# Patient Record
Sex: Female | Born: 1950
Health system: Southern US, Community
[De-identification: ages and names within clinical notes are randomized; demographics above are authoritative.]

## PROBLEM LIST (undated history)

## (undated) DIAGNOSIS — K7460 Unspecified cirrhosis of liver: Secondary | ICD-10-CM

## (undated) DIAGNOSIS — E0789 Other specified disorders of thyroid: Secondary | ICD-10-CM

## (undated) DIAGNOSIS — C55 Malignant neoplasm of uterus, part unspecified: Secondary | ICD-10-CM

## (undated) DIAGNOSIS — I10 Essential (primary) hypertension: Secondary | ICD-10-CM

## (undated) DIAGNOSIS — R7989 Other specified abnormal findings of blood chemistry: Secondary | ICD-10-CM

## (undated) DIAGNOSIS — A281 Cat-scratch disease: Secondary | ICD-10-CM

## (undated) DIAGNOSIS — K5790 Diverticulosis of intestine, part unspecified, without perforation or abscess without bleeding: Secondary | ICD-10-CM

## (undated) HISTORY — DX: Unspecified cirrhosis of liver: K74.60

## (undated) HISTORY — DX: Malignant neoplasm of uterus, part unspecified: C55

## (undated) HISTORY — DX: Other specified abnormal findings of blood chemistry: R79.89

## (undated) HISTORY — PX: OTHER SURGICAL HISTORY: SHX169

## (undated) HISTORY — PX: CHOLECYSTECTOMY: SHX55

## (undated) HISTORY — DX: Diverticulosis of intestine, part unspecified, without perforation or abscess without bleeding: K57.90

## (undated) HISTORY — PX: BLADDER SURGERY: SHX569

## (undated) HISTORY — DX: Essential (primary) hypertension: I10

## (undated) HISTORY — PX: LIVER BIOPSY: SHX301

## (undated) HISTORY — PX: KNEE SURGERY: SHX244

## (undated) HISTORY — DX: Cat-scratch disease: A28.1

## (undated) HISTORY — DX: Other specified disorders of thyroid: E07.89

## (undated) SURGERY — Surgical Case
Anesthesia: *Unknown

---

## 2016-11-28 HISTORY — PX: ABDOMINAL HYSTERECTOMY: SHX81

## 2017-12-11 IMAGING — US US BIOPSY CORE LIVER
1 series · 7 of 7 positions shown · non-contrast
Comparison: none

CLINICAL DATA: Suspected cirrhosis.  No ascites on previous CT

EXAM:
ULTRASOUND-GUIDED CORE LIVER BIOPSY
TECHNIQUE: An ultrasound guided liver biopsy was thoroughly discussed with the
patient and questions were answered. The benefits, risks,
alternatives, and complications were also discussed. The patient
understands and wishes to proceed with the procedure. A verbal as
well as written consent was obtained.

[Series 1: us biopsy core liver · 0.25mm/px · 7 of 7 slices shown]
[im 1/7]
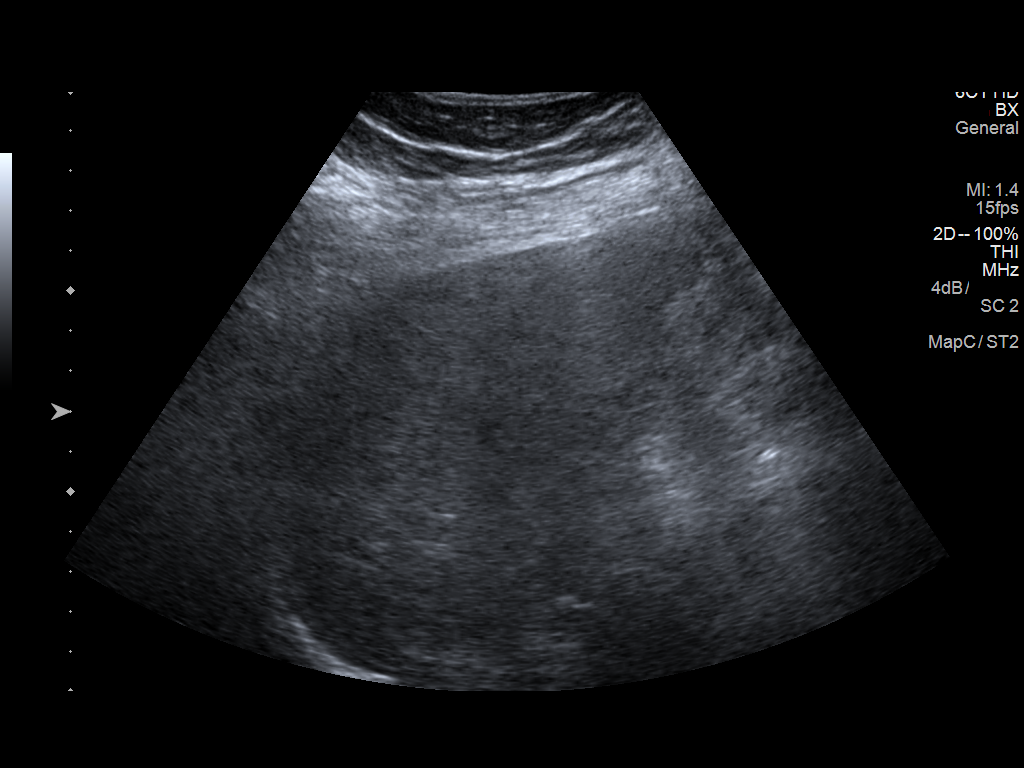
[im 2/7]
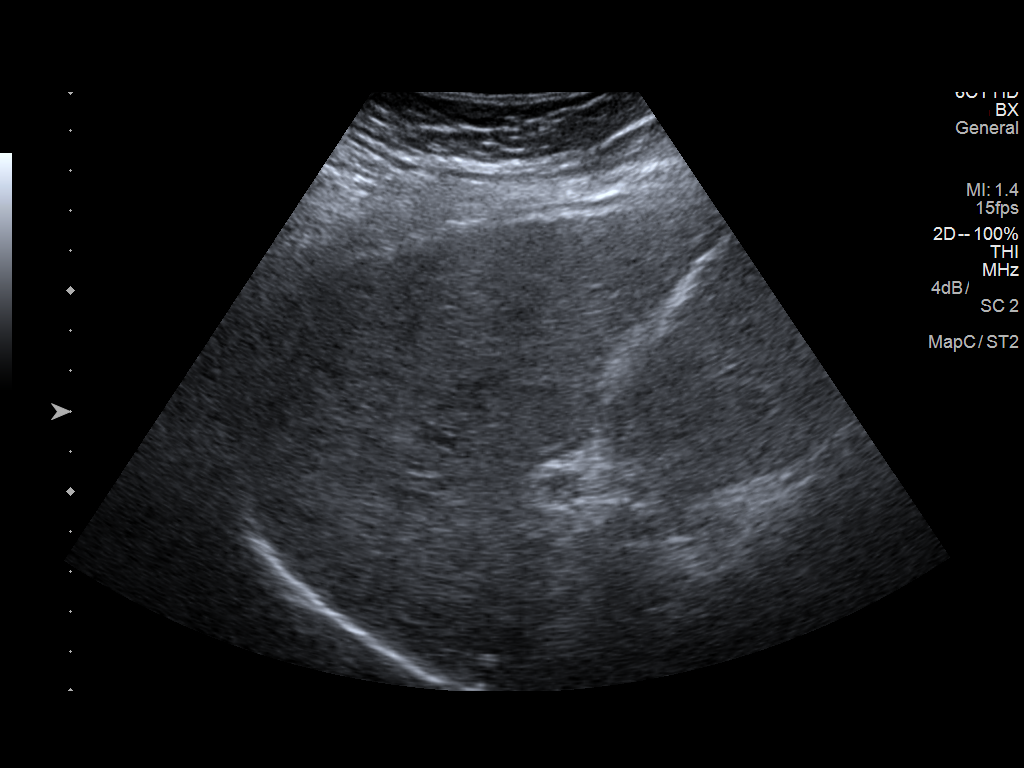
[im 3/7]
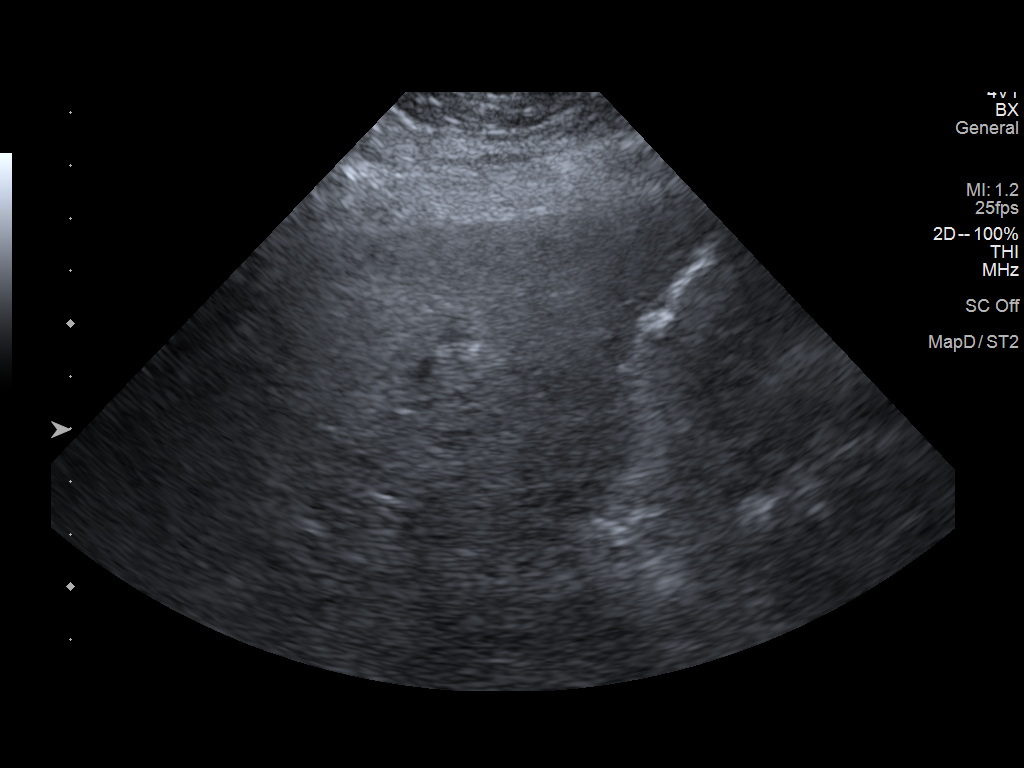
[im 4/7]
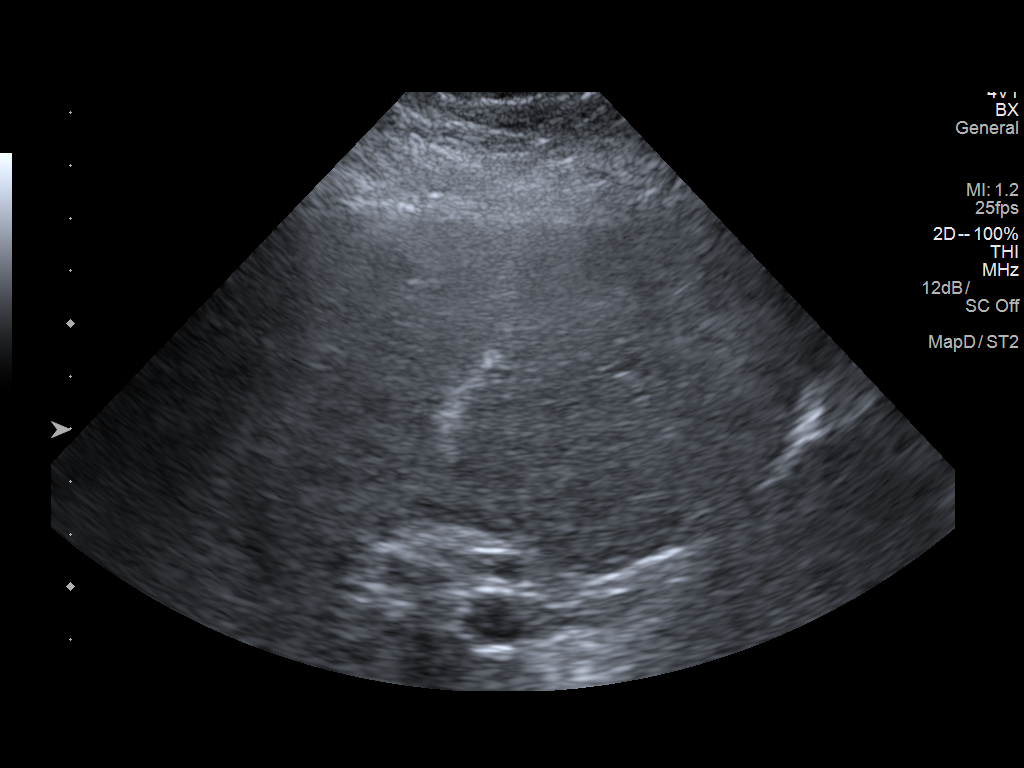
[im 5/7]
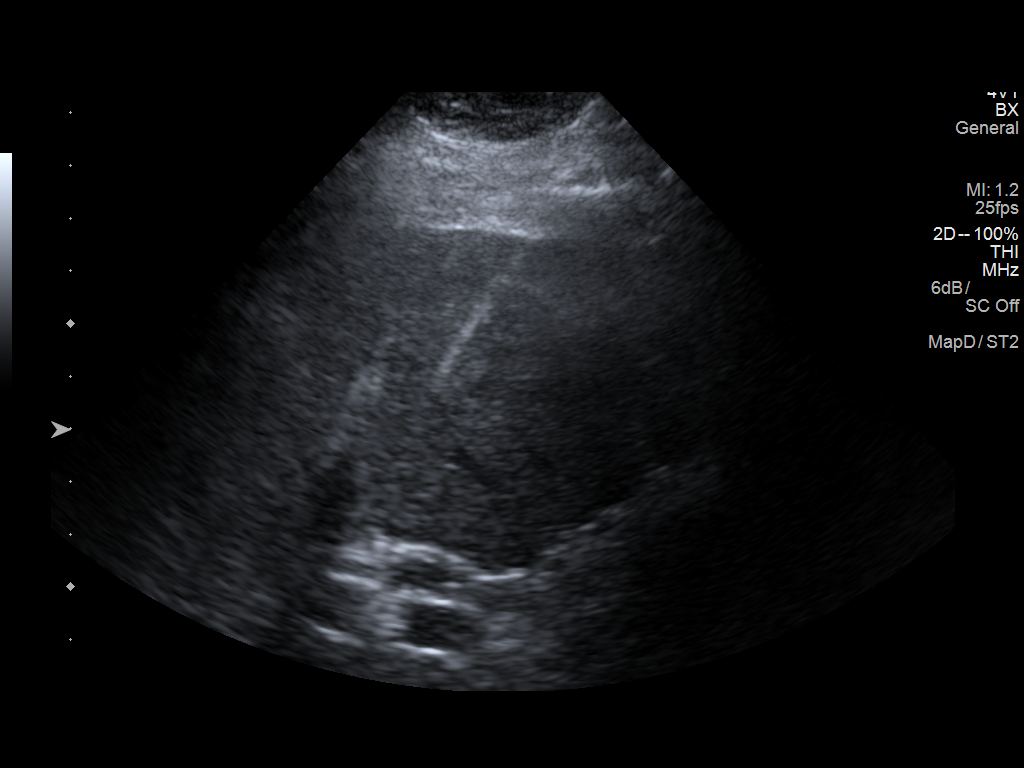
[im 6/7]
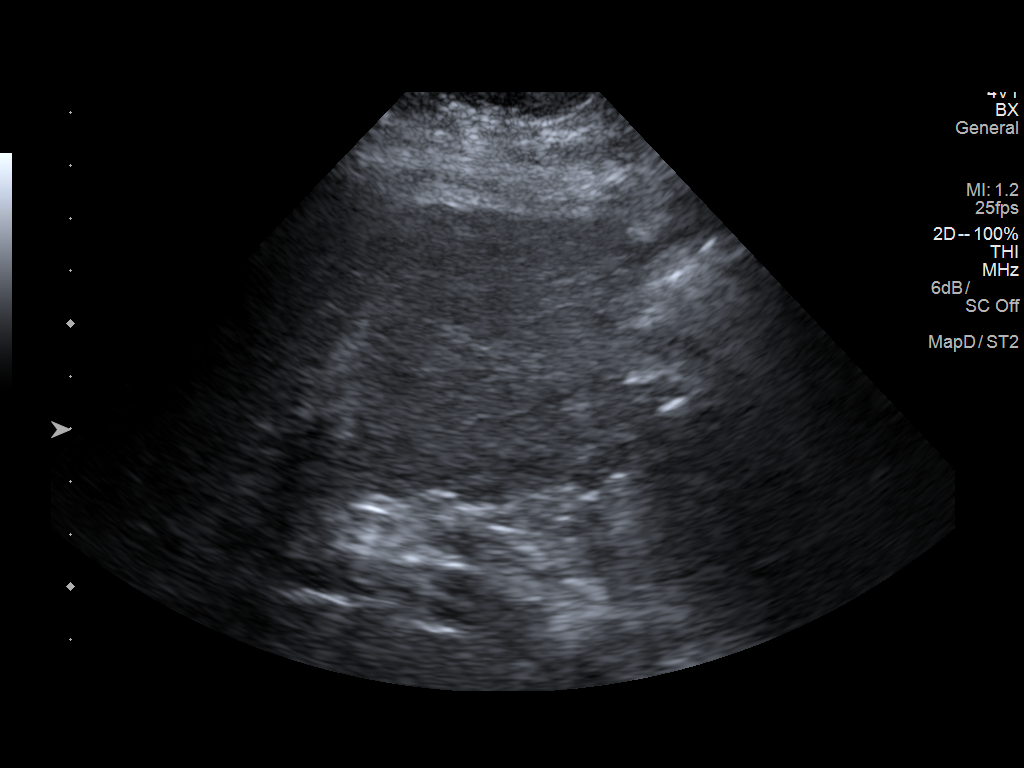
[im 7/7]
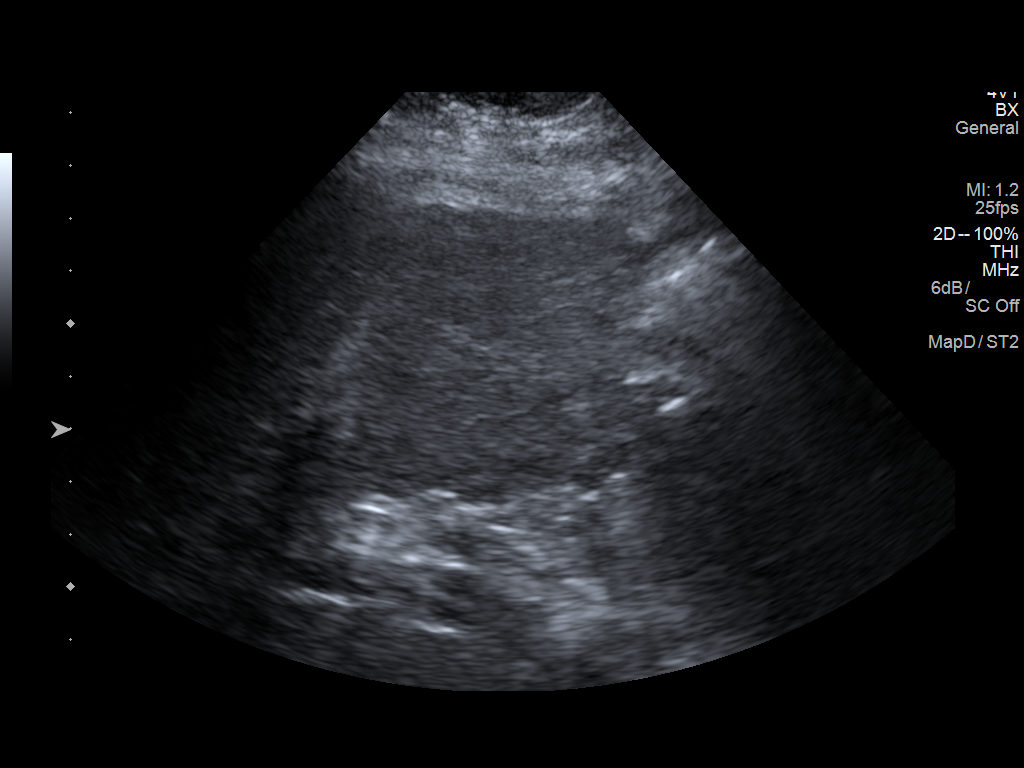

[7 of 7 positions shown; findings below may reference images not displayed]

Survey ultrasound of the liver was performed and an appropriate skin
entry site was determined. Skin site was marked, prepped with
Betadine, and draped in usual sterile fashion, and infiltrated
locally with 1% lidocaine.

Intravenous Fentanyl and Versed were administered as conscious
sedation during continuous monitoring of the patient's level of
consciousness and physiological / cardiorespiratory status by the
radiology RN, with a total moderate sedation time of 10 minutes.

On a 17 gauge trocar needle was advanced under ultrasound guidance
into the liver. 3 coaxial [XL] core samples were then obtained
through the guide needle. The guide needle was removed. Post
procedure scans demonstrate no apparent complication.

COMPLICATIONS:
COMPLICATIONS
None immediate
FINDINGS: Limited survey ultrasound of the liver demonstrates no focal lesion.
Representative core biopsy samples obtained as above.
IMPRESSION: 1. Technically successful ultrasound guided core liver biopsy.

## 2017-12-16 ENCOUNTER — Encounter: Payer: Self-pay | Admitting: *Deleted

## 2017-12-19 ENCOUNTER — Ambulatory Visit (INDEPENDENT_AMBULATORY_CARE_PROVIDER_SITE_OTHER): Payer: Medicare Other | Admitting: Physician Assistant

## 2017-12-19 ENCOUNTER — Other Ambulatory Visit (INDEPENDENT_AMBULATORY_CARE_PROVIDER_SITE_OTHER): Payer: Medicare Other

## 2017-12-19 ENCOUNTER — Encounter (INDEPENDENT_AMBULATORY_CARE_PROVIDER_SITE_OTHER): Payer: Self-pay

## 2017-12-19 ENCOUNTER — Encounter: Payer: Self-pay | Admitting: Physician Assistant

## 2017-12-19 VITALS — BP 136/84 | HR 72 | Ht 66.0 in | Wt 287.0 lb

## 2017-12-19 DIAGNOSIS — K7460 Unspecified cirrhosis of liver: Secondary | ICD-10-CM

## 2017-12-19 DIAGNOSIS — Z1211 Encounter for screening for malignant neoplasm of colon: Secondary | ICD-10-CM

## 2017-12-19 LAB — PROTIME-INR
INR: 1.2 ratio — AB (ref 0.8–1.0)
PROTHROMBIN TIME: 14.5 s — AB (ref 9.6–13.1)

## 2017-12-19 LAB — FERRITIN: FERRITIN: 198 ng/mL (ref 10.0–291.0)

## 2017-12-19 MED ORDER — NA SULFATE-K SULFATE-MG SULF 17.5-3.13-1.6 GM/177ML PO SOLN: 1.0000 | Freq: Once | ORAL | 0 refills | Status: AC

## 2017-12-19 NOTE — Progress Notes (Signed)
Subjective:    Patient ID: Claudia Weber, female    DOB: 01/10/1951, 67 y.o.   MRN: [id number]  HPI Claudia Weber is a pleasant 67 y.o. white female, new to GI today referred by Heide Scales NP, requesting Dr. Fuller Plan.  Patient is referred for recent abnormal CT scan. Patient has history of morbid obesity, hypothyroidism, hypertension, and was diagnosed with uterine cancer last summer and underwent TAH, BSO in July 2018.  She is being followed by Dr. Claiborne Billings in [place].  She did not require any adjuvant therapy.   Patient reports being told that she had mildly elevated liver tests over the past several years.  She has no current complaints of abdominal pain.  She says she gets occasional right back pain her Appetite has been fine, weight has been stable, no recent changes in bowel habits melena or hematochezia. Has not had any prior GI evaluation or endoscopic evaluation. CT of the abdomen and pelvis was done on 12/06/2017 for follow-up of pancreatic cystic lesion noted on CT 1 year prior.  This showed coronary atherosclerosis, aortic atherosclerosis, diffusely irregular liver surface with relative hypertrophy of the lateral segment of the left lobe compatible with cirrhosis there were scattered granulomatous calcifications in the liver unchanged no liver mass patient is status post cholecystectomy.  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  Also noted diffuse colonic diverticulosis. We also have a copy of CT scan from 2018 May-which again showed a 27 x 22 mm lobulated cystic lesion in the pancreatic tail area unchanged compared to prior exam sigmoid diverticulosis and fatty infiltration of the liver noted. Labs  from 10/11/2017 T bili 1.5, AST 58 LFTs otherwise normal CBC within normal limits other than platelet count of 125, sed rate of 55, ANA positive at 1-3 20 speckled pattern.  Patient has a brother who has hepatitis C, no other family history of liver disease that she is aware of.  She has not had any prior hepatitis, does not drink alcohol regularly.  Review of Systems Pertinent positive and negative review of systems were noted in the above HPI section.  All other review of systems was otherwise negative.  Outpatient Encounter Medications as of 12/19/2017  Medication Sig  . levothyroxine (SYNTHROID, LEVOTHROID) 50 MCG tablet Take 50 mcg by mouth daily before breakfast.  . lisinopril (PRINIVIL,ZESTRIL) 20 MG tablet Take 20 mg by mouth daily.  . Na Sulfate-K Sulfate-Mg Sulf 17.5-3.13-1.6 GM/177ML SOLN Take 1 kit by mouth once for 1 dose.   No facility-administered encounter medications on file as of 12/19/2017.    Allergies  Allergen Reactions  . Codeine    There are no active problems to display for this patient.  Social History   Socioeconomic History  . Marital status: Married    Spouse name: Not on file  . Number of children: 2  . Years of education: Not on file  . Highest education level: Not on file  Occupational History  . Occupation: retired  Scientific laboratory technician  . Financial resource strain: Not on file  . Food insecurity:    Worry: Not on file    Inability: Not on file  . Transportation needs:    Medical: Not on file    Non-medical: Not  on file  Tobacco Use  . Smoking status: Never Smoker  . Smokeless tobacco: Never Used  Substance and Sexual Activity  . Alcohol use: Not Currently  . Drug use: Never  . Sexual activity: Not on file  Lifestyle  . Physical activity:    Days per week: Not on file    Minutes per session: Not on file  . Stress: Not on file  Relationships  . Social connections:    Talks on phone: Not on file    Gets together: Not on file    Attends  religious service: Not on file    Active member of club or organization: Not on file    Attends meetings of clubs or organizations: Not on file    Relationship status: Not on file  . Intimate partner violence:    Fear of current or ex partner: Not on file    Emotionally abused: Not on file    Physically abused: Not on file    Forced sexual activity: Not on file  Other Topics Concern  . Not on file  Social History Narrative  . Not on file    Claudia Weber's family history includes Colitis in her mother; Heart disease in her maternal grandfather and maternal grandmother; Hepatitis in her brother; Leukemia in her cousin; Liver cancer in her cousin; Lung cancer in her cousin and mother; Throat cancer in her mother; Uterine cancer in her sister.      Objective:    Vitals:   12/19/17 1449  BP: 136/84  Pulse: 72    Physical Exam; well-developed older white female in no acute distress, accompanied by her husband, both pleasant blood pressure 136/84 pulse 72, height 5 foot 6, weight 287, BMI 46.3.  HEENT ;nontraumatic normocephalic EOMI PERRLA sclera anicteric, Oropharynx benign, Cardiovascular ;regular rate and rhythm with S1-S2 no murmur rub or gallop, Pulmonary ;clear bilaterally, Abdomen; obese, soft, no appreciable fluid wave, liver is palpable in the right upper quadrant down 4 fingerbreadths at the left lobe somewhat nodular nontender, spleen not palpable, Bowel sounds present cholecystectomy scar present.  Rectal; exam not done, Extremities; no clubbing cyanosis or edema skin warm dry, Neuro psych; alert and oriented, grossly nonfocal mood and affect appropriate       Assessment & Plan:   #18 67 y.o. white female with new diagnosis of cirrhosis in setting of mild chronic elevation of LFTs. This appears to be compensated though she does have mild thrombocytopenia. Etiology of cirrhosis is not clear.  Rule out [facility], rule out autoimmune liver disease with positive ANA.  Consider  PBC.  #2 morbid obesity-BMI 96 #3 hypertension #4.  Hypothyroidism #5.  History of uterine cancer status post total abdominal hysterectomy BSO July 2018 #6 colon cancer surveillance-no prior colonoscopy #7 diverticulosis #8 coronary and aortic atherosclerotic disease  Plan; Patient will be scheduled for EGD for variceal surveillance, and screening colonoscopy with Dr. Fuller Plan.  Both procedures were discussed in detail with the patient including indications risks and benefits and she is agreeable to proceed. Check pro time/INR, AFP, hepatitis serologies, AMA, anti-smooth muscle antibody, ceruloplasmin, ferritin and alpha-1 antitrypsin levels. Advised to avoid alcohol, focus on gradual weight loss, daily exercise. Will plan office follow-up with Dr. Fuller Plan in 2 to 3 months Further plans pending results of extensive labs above.   Jonh Mcqueary S Jestine Bicknell PA-C 12/19/2017   Cc: No ref. provider found

## 2017-12-19 NOTE — Patient Instructions (Signed)
Your provider has requested that you go to the basement level for lab work before leaving today. Press "B" on the elevator. The lab is located at the first door on the left as you exit the elevator.  Start a Low fat diet with gradual weight loss. Walk daily 20- 30 minutes daily.   Avoid alcohol.   You have been scheduled for an endoscopy and colonoscopy. Please follow the written instructions given to you at your visit today. Please pick up your prep supplies at the pharmacy within the next 1-3 days. If you use inhalers (even only as needed), please bring them with you on the day of your procedure. Your physician has requested that you go to www.startemmi.com and enter the access code given to you at your visit today. This web site gives a general overview about your procedure. However, you should still follow specific instructions given to you by our office regarding your preparation for the procedure.

## 2017-12-20 NOTE — Progress Notes (Signed)
Reviewed and agree with initial management plan.  Stanislaw Acton T. Correy Weidner, MD FACG 

## 2017-12-22 LAB — HEPATITIS C ANTIBODY
HEP C AB: NONREACTIVE
SIGNAL TO CUT-OFF: 0.06 (ref <1.00)

## 2017-12-22 LAB — ANTI-SMOOTH MUSCLE ANTIBODY, IGG: Actin (Smooth Muscle) Antibody (IGG): 37 U — ABNORMAL HIGH (ref <20)

## 2017-12-22 LAB — HEPATITIS A ANTIBODY, TOTAL: Hepatitis A AB,Total: REACTIVE — AB

## 2017-12-22 LAB — HEPATITIS B SURFACE ANTIGEN: HEP B S AG: NONREACTIVE

## 2017-12-22 LAB — CERULOPLASMIN: CERULOPLASMIN: 36 mg/dL (ref 18–53)

## 2017-12-22 LAB — AFP TUMOR MARKER: AFP-Tumor Marker: 7.8 ng/mL — ABNORMAL HIGH

## 2017-12-22 LAB — ALPHA-1-ANTITRYPSIN: A1 ANTITRYPSIN SER: 172 mg/dL (ref 83–199)

## 2017-12-22 LAB — HEPATITIS B SURFACE ANTIBODY,QUALITATIVE: HEP B S AB: NONREACTIVE

## 2017-12-23 ENCOUNTER — Other Ambulatory Visit: Payer: Self-pay

## 2017-12-23 DIAGNOSIS — K7460 Unspecified cirrhosis of liver: Secondary | ICD-10-CM

## 2017-12-26 ENCOUNTER — Other Ambulatory Visit: Payer: Medicare Other

## 2017-12-26 DIAGNOSIS — K7460 Unspecified cirrhosis of liver: Secondary | ICD-10-CM

## 2017-12-28 LAB — MITOCHONDRIAL ANTIBODIES

## 2018-02-22 ENCOUNTER — Encounter

## 2018-02-22 ENCOUNTER — Encounter: Payer: Self-pay | Admitting: Gastroenterology

## 2018-02-22 ENCOUNTER — Ambulatory Visit (AMBULATORY_SURGERY_CENTER): Payer: Medicare Other | Admitting: Gastroenterology

## 2018-02-22 VITALS — BP 149/67 | HR 67 | Temp 98.0°F | Resp 18 | Ht 66.0 in | Wt 287.0 lb

## 2018-02-22 DIAGNOSIS — D123 Benign neoplasm of transverse colon: Secondary | ICD-10-CM

## 2018-02-22 DIAGNOSIS — K7460 Unspecified cirrhosis of liver: Secondary | ICD-10-CM

## 2018-02-22 DIAGNOSIS — Z1211 Encounter for screening for malignant neoplasm of colon: Secondary | ICD-10-CM | POA: Diagnosis present

## 2018-02-22 DIAGNOSIS — D122 Benign neoplasm of ascending colon: Secondary | ICD-10-CM | POA: Diagnosis not present

## 2018-02-22 MED ORDER — SODIUM CHLORIDE 0.9 % IV SOLN: 500.0000 mL | Freq: Once | INTRAVENOUS | Status: DC

## 2018-02-22 NOTE — Patient Instructions (Signed)
Continue present medications. Return to GI office in 2-3 weeks. Please read handouts on Polyps, Diverticulosis, and High-Fiber Diet. No Aspirin, Ibuprofen, Naproxen, or other non-steriodal anti-inflammatory drugs for 2 weeks.     YOU HAD AN ENDOSCOPIC PROCEDURE TODAY AT [facility]:   Refer to the procedure report that was given to you for any specific questions about what was found during the examination.  If the procedure report does not answer your questions, please call your gastroenterologist to clarify.  If you requested that your care partner not be given the details of your procedure findings, then the procedure report has been included in a sealed envelope for you to review at your convenience later.  YOU SHOULD EXPECT: Some feelings of bloating in the abdomen. Passage of more gas than usual.  Walking can help get rid of the air that was put into your GI tract during the procedure and reduce the bloating. If you had a lower endoscopy (such as a colonoscopy or flexible sigmoidoscopy) you may notice spotting of blood in your stool or on the toilet paper. If you underwent a bowel prep for your procedure, you may not have a normal bowel movement for a few days.  Please Note:  You might notice some irritation and congestion in your nose or some drainage.  This is from the oxygen used during your procedure.  There is no need for concern and it should clear up in a day or so.  SYMPTOMS TO REPORT IMMEDIATELY:   Following lower endoscopy (colonoscopy or flexible sigmoidoscopy):  Excessive amounts of blood in the stool  Significant tenderness or worsening of abdominal pains  Swelling of the abdomen that is new, acute  Fever of 100F or higher   Following upper endoscopy (EGD)  Vomiting of blood or coffee ground material  New chest pain or pain under the shoulder blades  Painful or persistently difficult swallowing  New shortness of breath  Fever of 100F or  higher  Black, tarry-looking stools  For urgent or emergent issues, a gastroenterologist can be reached at any hour by calling [phone].   DIET:  We do recommend a small meal at first, but then you may proceed to your regular diet.  Drink plenty of fluids but you should avoid alcoholic beverages for 24 hours.  ACTIVITY:  You should plan to take it easy for the rest of today and you should NOT DRIVE or use heavy machinery until tomorrow (because of the sedation medicines used during the test).    FOLLOW UP: Our staff will call the number listed on your records the next business day following your procedure to check on you and address any questions or concerns that you may have regarding the information given to you following your procedure. If we do not reach you, we will leave a message.  However, if you are feeling well and you are not experiencing any problems, there is no need to return our call.  We will assume that you have returned to your regular daily activities without incident.  If any biopsies were taken you will be contacted by phone or by letter within the next 1-3 weeks.  Please call us at [phone] if you have not heard about the biopsies in 3 weeks.    SIGNATURES/CONFIDENTIALITY: You and/or your care partner have signed paperwork which will be entered into your electronic medical record.  These signatures attest to the fact that that the information above on your After Visit  Summary has been reviewed and is understood.  Full responsibility of the confidentiality of this discharge information lies with you and/or your care-partner.

## 2018-02-22 NOTE — Progress Notes (Signed)
6.0 nasopharyngeal airway placed in right nare without trauma. sats improved.tb

## 2018-02-22 NOTE — Progress Notes (Signed)
Patient states cortisone injections to both knees.

## 2018-02-22 NOTE — Op Note (Signed)
[place] Patient Name: Claudia Weber Procedure Date: 02/22/2018 3:46 PM MRN: [id number] Endoscopist: Ladene Artist , MD Age: 67 Referring MD:  Date of Birth: [birth year] Gender: Female Account #: [id number] Procedure:                Upper GI endoscopy Indications:              Screening procedure. Cirrhosis screen for varices. Medicines:                Monitored Anesthesia Care Procedure:                Pre-Anesthesia Assessment:                           - Prior to the procedure, a History and Physical                            was performed, and patient medications and                            allergies were reviewed. The patient's tolerance of                            previous anesthesia was also reviewed. The risks                            and benefits of the procedure and the sedation                            options and risks were discussed with the patient.                            All questions were answered, and informed consent                            was obtained. Prior Anticoagulants: The patient has                            taken no previous anticoagulant or antiplatelet                            agents. ASA Grade Assessment: III - A patient with                            severe systemic disease. After reviewing the risks                            and benefits, the patient was deemed in                            satisfactory condition to undergo the procedure.                           After obtaining informed consent, the endoscope was  passed under direct vision. Throughout the                            procedure, the patient's blood pressure, pulse, and                            oxygen saturations were monitored continuously. The                            Model GIF-HQ190 [id number]) scope was introduced                            through the mouth, and advanced to the second part   of duodenum. The upper GI endoscopy was                            accomplished without difficulty. The patient                            tolerated the procedure well. Scope In: Scope Out: Findings:                 The esophagus was normal.                           The stomach was normal.                           The examined duodenum was normal.                           The cardia and gastric fundus were normal on                            retroflexion. Complications:            No immediate complications. Estimated Blood Loss:     Estimated blood loss: none. Impression:               - Normal esophagus.                           - Normal stomach.                           - Normal examined duodenum.                           - No specimens collected. Recommendation:           - Patient has a contact number available for                            emergencies. The signs and symptoms of potential                            delayed complications were discussed with the  patient. Return to normal activities tomorrow.                            Written discharge instructions were provided to the                            patient.                           - Resume previous diet.                           - Continue present medications.                           - Repeat upper endoscopy in 3 years for screening                            purposes.                           - Return to GI office in 2-3 weeks with me or Amy                            [place], PA-C. Ladene Artist, MD 02/22/2018 4:33:10 PM This report has been signed electronically.

## 2018-02-22 NOTE — Progress Notes (Signed)
Pt's states no medical or surgical changes since previsit or office visit. 

## 2018-02-22 NOTE — Progress Notes (Signed)
Called to room to assist during endoscopic procedure.  Patient ID and intended procedure confirmed with present staff. Received instructions for my participation in the procedure from the performing physician.  

## 2018-02-22 NOTE — Progress Notes (Signed)
To PACU VSS  Report to Rn. Nasal airway removed no heme.tb

## 2018-02-22 NOTE — Op Note (Signed)
[place] Patient Name: Claudia Weber Procedure Date: 02/22/2018 3:46 PM MRN: [id number] Endoscopist: Ladene Artist , MD Age: 67 Referring MD:  Date of Birth: 10-May-1951 Gender: Female Account #: [id number] Procedure:                Colonoscopy Indications:              Screening for colorectal malignant neoplasm Medicines:                Monitored Anesthesia Care Procedure:                Pre-Anesthesia Assessment:                           - Prior to the procedure, a History and Physical                            was performed, and patient medications and                            allergies were reviewed. The patient's tolerance of                            previous anesthesia was also reviewed. The risks                            and benefits of the procedure and the sedation                            options and risks were discussed with the patient.                            All questions were answered, and informed consent                            was obtained. Prior Anticoagulants: The patient has                            taken no previous anticoagulant or antiplatelet                            agents. ASA Grade Assessment: III - A patient with                            severe systemic disease. After reviewing the risks                            and benefits, the patient was deemed in                            satisfactory condition to undergo the procedure.                           After obtaining informed consent, the colonoscope  was passed under direct vision. Throughout the                            procedure, the patient's blood pressure, pulse, and                            oxygen saturations were monitored continuously. The                            Colonoscope was introduced through the anus and                            advanced to the the ascending colon. The rectum was                            photographed.  The quality of the bowel preparation                            was good. The patient tolerated the procedure well.                            The colonoscopy was technically difficult and                            complex due to restricted mobility of the colon.                            Successful completion of the procedure to the                            ascending colon was aided by using manual pressure,                            withdrawing and reinserting the scope,                            straightening and shortening the scope to obtain                            bowel loop reduction and applying abdominal                            pressure. Scope In: 3:59:32 PM Scope Out: 4:16:58 PM Scope Withdrawal Time: 0 hours 9 minutes 7 seconds  Total Procedure Duration: 0 hours 17 minutes 26 seconds  Findings:                 The perianal and digital rectal examinations were                            normal.                           A 10 mm polyp was found in the transverse colon.  The polyp was sessile. The polyp was removed with a                            cold snare. Resection and retrieval were complete.                           Multiple medium-mouthed diverticula were found in                            the sigmoid colon, descending colon and transverse                            colon. There was no evidence of diverticular                            bleeding.                           The exam was otherwise without abnormality on                            direct and retroflexion views. Complications:            No immediate complications. Estimated blood loss:                            None. Estimated Blood Loss:     Estimated blood loss: none. Impression:               - Incomplete colonoscopy to ascending colon.                           - One 10 mm polyp in the transverse colon, removed                            with a cold snare.  Resected and retrieved.                           - Moderate diverticulosis in the sigmoid colon, in                            the descending colon and in the transverse colon.                           - The examination was otherwise normal on direct                            and retroflexion views. Recommendation:           - Repeat colonoscopy in 3 years for surveillance if                            polyp is precancerous.                           - Patient has a contact number  available for                            emergencies. The signs and symptoms of potential                            delayed complications were discussed with the                            patient. Return to normal activities tomorrow.                            Written discharge instructions were provided to the                            patient.                           - High fiber diet.                           - Continue present medications.                           - Await pathology results.                           - No aspirin, ibuprofen, naproxen, or other                            non-steroidal anti-inflammatory drugs for 2 weeks                            after polyp removal.                           - Perform an air contrast barium enema for                            incomplete colonoscopy in 1 month. Ladene Artist, MD 02/22/2018 4:29:42 PM This report has been signed electronically.

## 2018-02-23 ENCOUNTER — Telehealth: Payer: Self-pay | Admitting: *Deleted

## 2018-02-23 NOTE — Telephone Encounter (Signed)
  Follow up Call-  Call back number 02/22/2018  Post procedure Call Back phone  # [phone]  Permission to leave phone message Yes  Some recent data might be hidden     Patient questions:  Do you have a fever, pain , or abdominal swelling? No. Pain Score  0 *  Have you tolerated food without any problems? Yes.    Have you been able to return to your normal activities? Yes.    Do you have any questions about your discharge instructions: Diet   No. Medications  No. Follow up visit  No.  Do you have questions or concerns about your Care? No.  Actions: * If pain score is 4 or above: No action needed, pain <4.

## 2018-03-10 ENCOUNTER — Telehealth: Payer: Self-pay

## 2018-03-10 DIAGNOSIS — Z1211 Encounter for screening for malignant neoplasm of colon: Secondary | ICD-10-CM

## 2018-03-10 NOTE — Telephone Encounter (Signed)
Patient notified of the need for BE.  She is scheduled for [facility] on 03/28/18 11:00.  I mailed her instructions for BE. She will call if she has any questions.

## 2018-03-10 NOTE — Telephone Encounter (Signed)
-----   Message from Ladene Artist, MD sent at 03/10/2018  4:24 PM EDT ----- See colonoscopy report. Is [facility]BE scheduled?

## 2018-03-13 ENCOUNTER — Encounter: Payer: Self-pay | Admitting: Gastroenterology

## 2018-03-17 ENCOUNTER — Other Ambulatory Visit (INDEPENDENT_AMBULATORY_CARE_PROVIDER_SITE_OTHER): Payer: Medicare Other

## 2018-03-17 ENCOUNTER — Encounter: Payer: Self-pay | Admitting: Gastroenterology

## 2018-03-17 ENCOUNTER — Ambulatory Visit (INDEPENDENT_AMBULATORY_CARE_PROVIDER_SITE_OTHER): Payer: Medicare Other | Admitting: Gastroenterology

## 2018-03-17 VITALS — BP 144/90 | HR 76 | Ht 65.0 in | Wt 283.2 lb

## 2018-03-17 DIAGNOSIS — R945 Abnormal results of liver function studies: Secondary | ICD-10-CM

## 2018-03-17 DIAGNOSIS — K7460 Unspecified cirrhosis of liver: Secondary | ICD-10-CM | POA: Diagnosis not present

## 2018-03-17 DIAGNOSIS — R7989 Other specified abnormal findings of blood chemistry: Secondary | ICD-10-CM

## 2018-03-17 LAB — HEPATIC FUNCTION PANEL
ALBUMIN: 3.8 g/dL (ref 3.5–5.2)
ALK PHOS: 93 U/L (ref 39–117)
ALT: 37 U/L — ABNORMAL HIGH (ref 0–35)
AST: 39 U/L — ABNORMAL HIGH (ref 0–37)
Bilirubin, Direct: 0.2 mg/dL (ref 0.0–0.3)
TOTAL PROTEIN: 7.6 g/dL (ref 6.0–8.3)
Total Bilirubin: 1.6 mg/dL — ABNORMAL HIGH (ref 0.2–1.2)

## 2018-03-17 NOTE — Patient Instructions (Addendum)
Your provider has requested that you go to the basement level for lab work before leaving today. Press "B" on the elevator. The lab is located at the first door on the left as you exit the elevator.   We have rescheduled your Barium Enema to 04/10/18 at 10:30 am at [facility]. You have already been given the instructions for the prep.  We have scheduled your liver biopsy to rule out autoimmune hepatitis at [facility] (admitting) on 04/24/18 at 8:00am. Please arrive at 6:00am in admitting with a driver. Nothing to eat or drink after midnight.   Normal BMI (Body Mass Index- based on height and weight) is between 23 and 30. Your BMI today is Body mass index is 47.14 kg/m. Marland Kitchen Please consider follow up  regarding your BMI with your Primary Care Provider.  Thank you for choosing me and [facility].  Pricilla Riffle. Dagoberto Ligas., MD., Marval Regal

## 2018-03-17 NOTE — Progress Notes (Signed)
    History of Present Illness: This is a 67 year old female returning up for further evaluation of cirrhosis.  Serologic evaluation showed an ANA titer of 1:320 and ASMA was positive. INR=1.2, ESR=55, AFP=7.8.  She has no gastrointestinal complaints.  CT abdomen with and without contrast in July 2019 showed diffuse irregular liver surface with relative hypertrophy of the lateral segment of the left liver lobe compatible with hepatic cirrhosis.  No liver masses.  Prior cholecystectomy.  No biliary dilatation.  Stable 4.4 x 3.0 mass abutting the tail of the pancreas and the splenic hilum which is favored to be a splenule with benign cystic splenic lesion which is stable in size compared to September 2014. She has no GI complaints.   Current Medications, Allergies, Past Medical History, Past Surgical History, Family History and Social History were reviewed in [employer] record.  Physical Exam: General: Well developed, well nourished, obese, no acute distress Head: Normocephalic and atraumatic Eyes:  sclerae anicteric, EOMI Ears: Normal auditory acuity Mouth: No deformity or lesions Lungs: Clear throughout to auscultation Heart: Regular rate and rhythm; no murmurs, rubs or bruits Abdomen: Soft, non tender and non distended. No masses, hepatosplenomegaly or hernias noted. Normal Bowel sounds Rectal: Not done Musculoskeletal: Symmetrical with no gross deformities  Pulses:  Normal pulses noted Extremities: No clubbing, cyanosis, edema or deformities noted Neurological: Alert oriented x 4, grossly nonfocal Psychological:  Alert and cooperative. Normal mood and affect   Assessment and Recommendations:  1. Cirrhosis, elevated LFTs. ANA, ASMA positive.  Rule out autoimmune hepatitis.  AFP mildly elevated without focal hepatic lesions. IgG and LFTs today.  We discussed her liver findings and  discussed liver biopsy for further evaluation.  All questions were addressed to the  patient's satisfaction.  Schedule liver biopsy.  Repeat AFP in 05/2018.  RUQ [place] in 05/2018.  2. Incomplete colonoscopy. For ACBE.   3.  Personal history of tubular adenoma.  A 3-year interval surveillance colonoscopy is recommended pending results from ACBE.  4.  Splenule with benign cystic lesion.  Stable in appearance over approximately 5 years. No further evaluation needed at this time.   I spent 25 minutes of face-to-face time with the patient. Greater than 50% of the time was spent counseling and coordinating care.

## 2018-03-18 LAB — IGG: IGG (IMMUNOGLOBIN G), SERUM: 1438 mg/dL (ref 600–1540)

## 2018-03-28 ENCOUNTER — Ambulatory Visit (HOSPITAL_COMMUNITY): Payer: Medicare Other

## 2018-04-10 ENCOUNTER — Ambulatory Visit (HOSPITAL_COMMUNITY)
Admission: RE | Admit: 2018-04-10 | Discharge: 2018-04-10 | Disposition: A | Discharge status: Home / Self Care | Payer: Medicare Other | Source: Ambulatory Visit | Attending: Gastroenterology | Admitting: Gastroenterology

## 2018-04-10 DIAGNOSIS — K5730 Diverticulosis of large intestine without perforation or abscess without bleeding: Secondary | ICD-10-CM | POA: Diagnosis not present

## 2018-04-10 DIAGNOSIS — Z1211 Encounter for screening for malignant neoplasm of colon: Secondary | ICD-10-CM | POA: Diagnosis not present

## 2018-04-10 IMAGING — RF DG BE W/ AIR HIGH DENSITY
7 of 10 series · 7 of 10 positions shown · non-contrast
Comparison: CT abdomen pelvis [DATE]

CLINICAL DATA: Screening for colon neoplasm. Incomplete colonoscopy

EXAM:
AIR CONTRAST BARIUM ENEMA
TECHNIQUE: Initial scout AP supine abdominal image obtained to insure adequate
colon cleansing. Barium was introduced into the colon in a
retrograde fashion and refluxed from the rectum to the distal
transverse colon. As much of the barium as possible was then removed
through the indwelling tube via gravity drain. Air was then
insufflated into the colon. Spot images of the colon followed by
overhead radiographs were obtained.
FLUOROSCOPY TIME:  Fluoroscopy Time:  2 minutes 6 second
Radiation Exposure Index (if provided by the fluoroscopic device):
Number of Acquired Spot Images: 0

[Series 1: t abdomen supine · 0.15mm/px · 1 of 1 slices shown (1 of 2)]
[im 1/1]
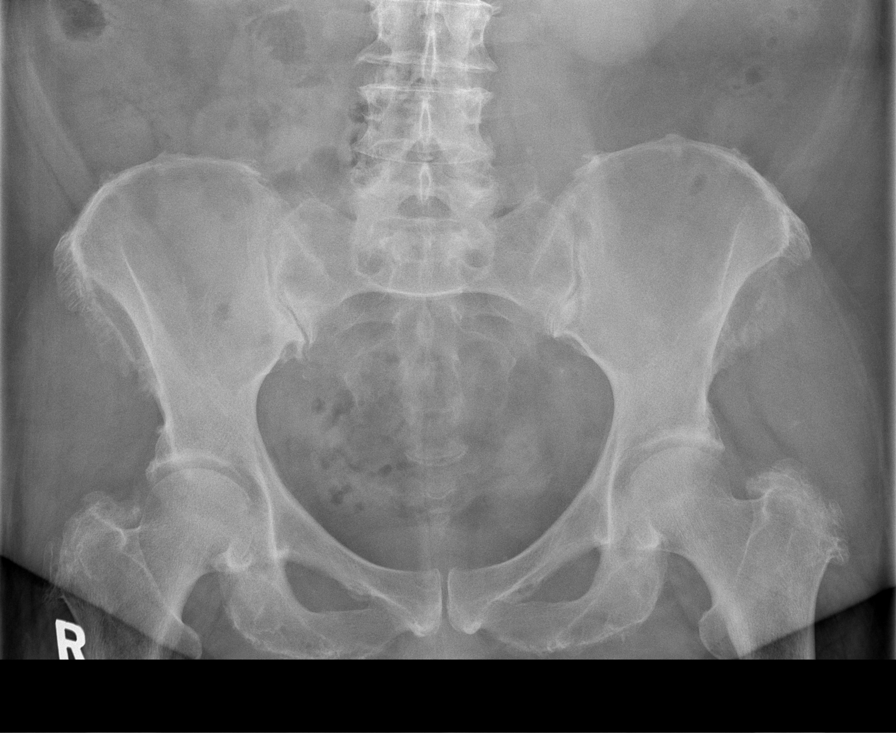

[Series 2: t abdomen supine · 0.15mm/px · 1 of 1 slices shown (2 of 2)]
[im 1/1]
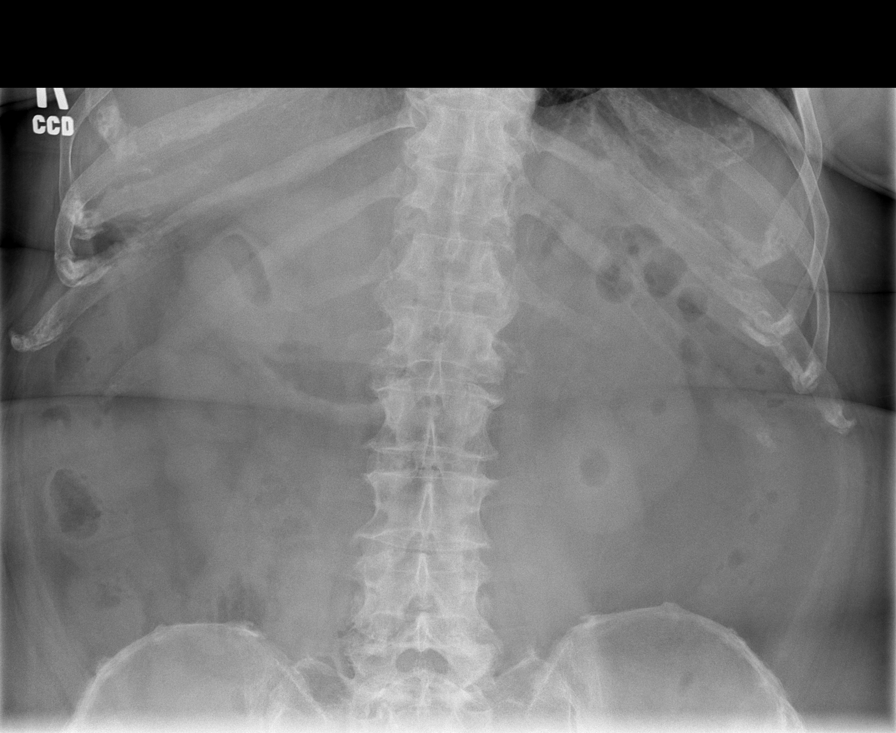

[Series 3: cp_standard · 0.18mm/px · 1 of 1 slices shown]
[im 1/1]
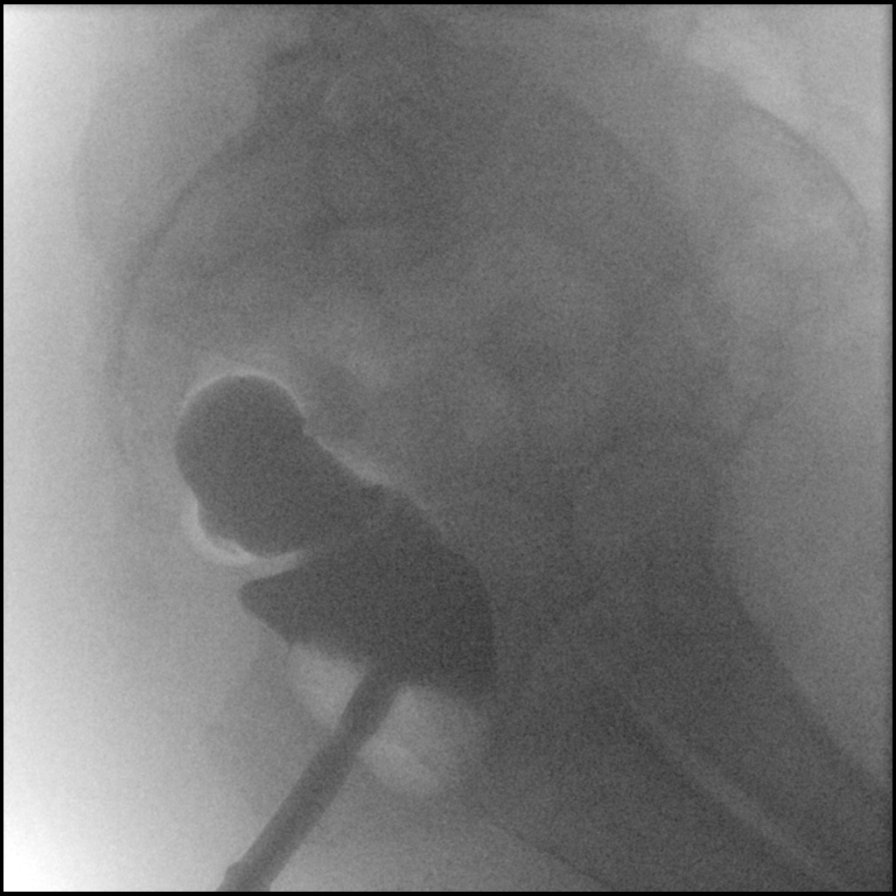

[Series 4: fluoro_barium singleshot_bw · 0.18mm/px · 1 of 1 slices shown (1 of 4)]
[im 1/1]
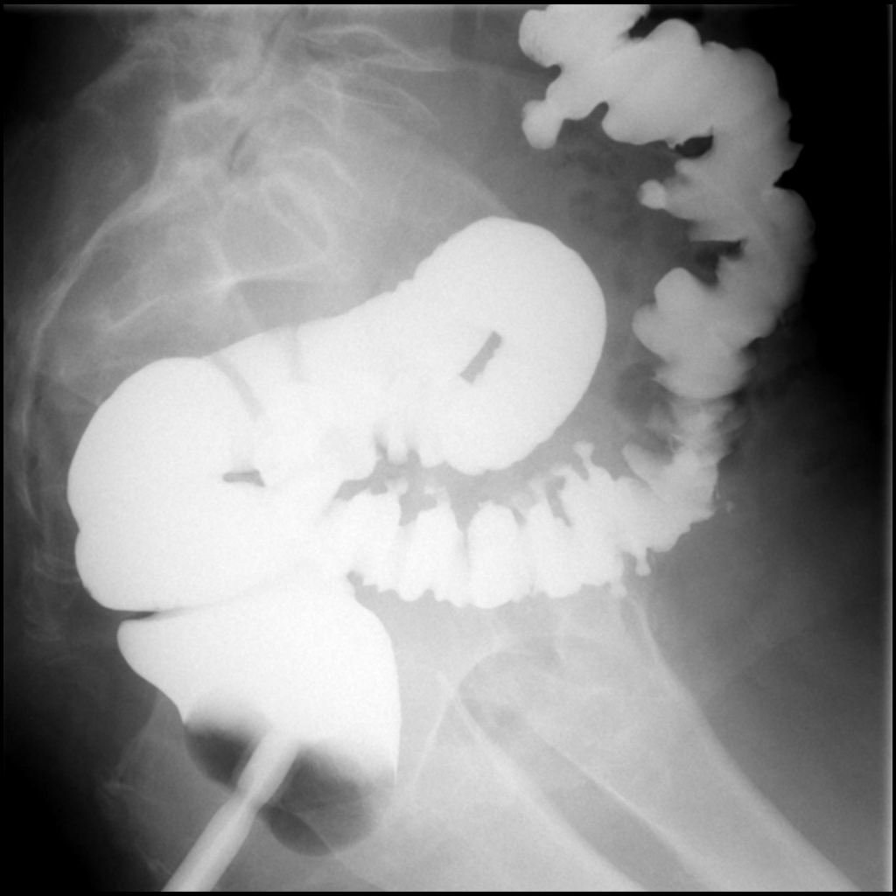

[Series 5: fluoro_barium singleshot_bw · 0.19mm/px · 1 of 1 slices shown (2 of 4)]
[im 1/1]
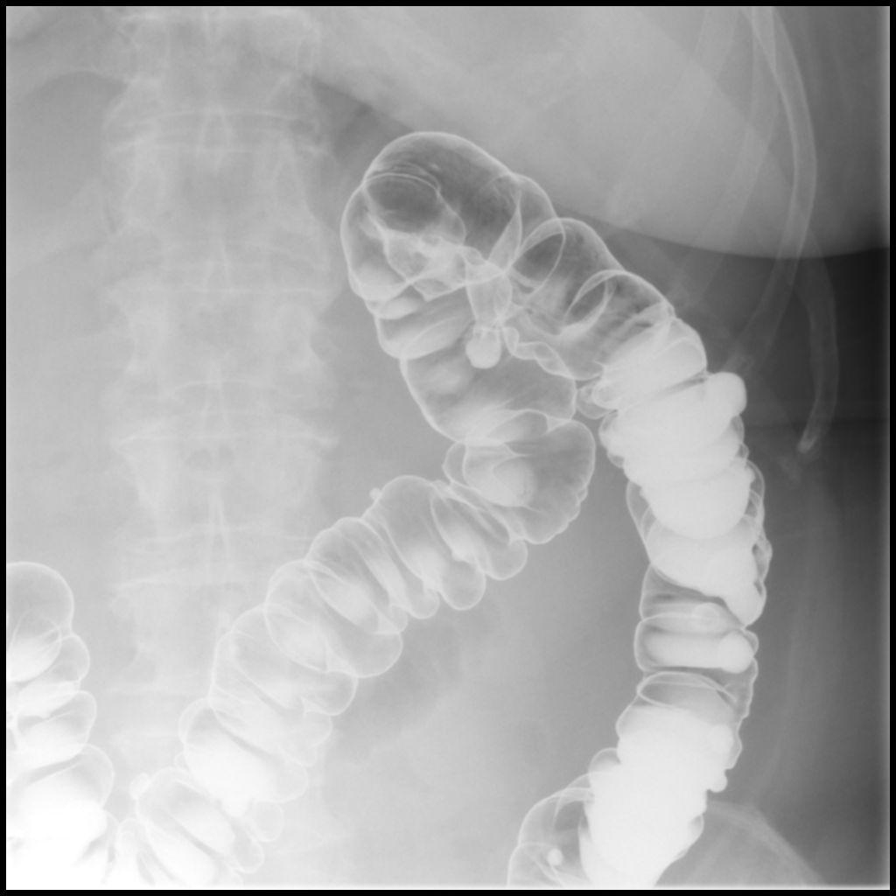

[Series 6: fluoro_barium singleshot_bw · 0.19mm/px · 1 of 1 slices shown (3 of 4)]
[im 1/1]
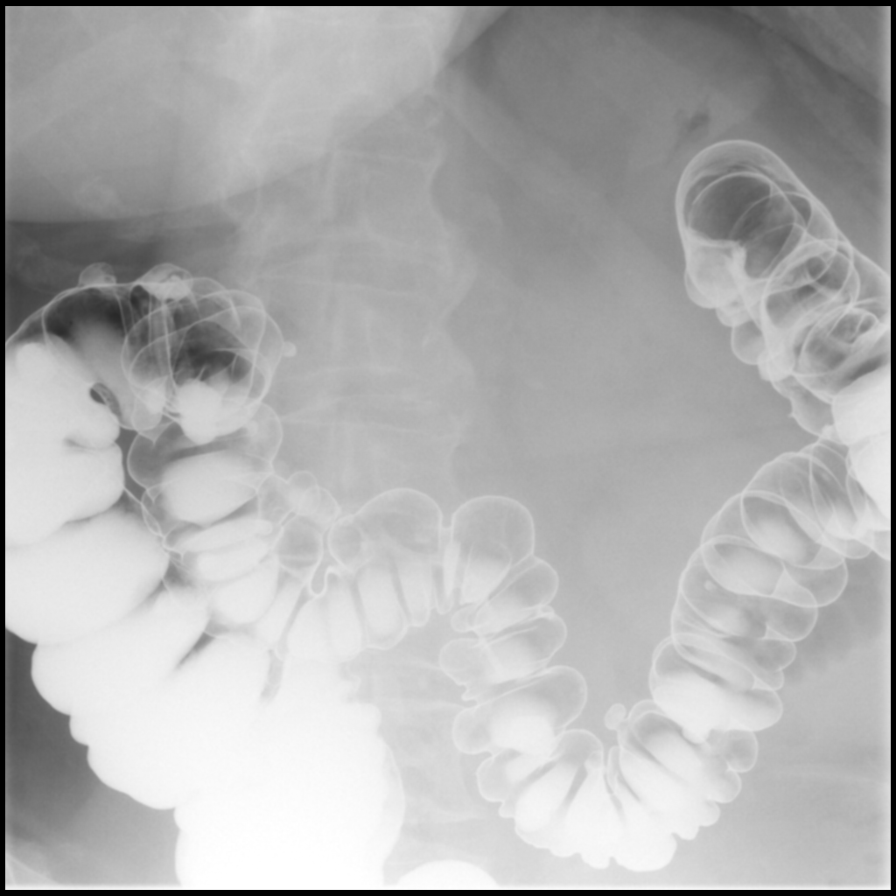

[Series 7: fluoro_barium singleshot_bw · 0.19mm/px · 1 of 1 slices shown (4 of 4)]
[im 1/1]
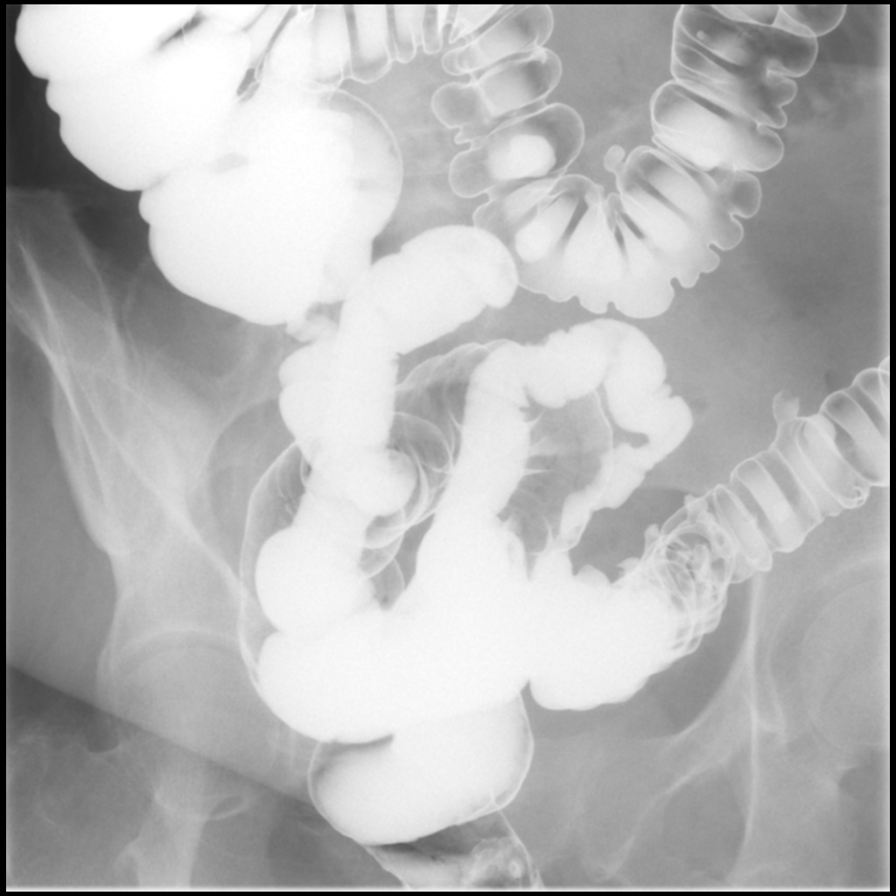

[7 of 10 positions shown; findings below may reference images not displayed]

FINDINGS: Preliminary KUB negative

The entire colon was evaluated with reflux into the terminal ileum.

Pan diverticulosis throughout the colon. Scattered diverticular
present diffusely, most prominent in the sigmoid colon.

Negative for stricture or mass. Negative for colonic obstruction.
Normal terminal ileum. Appendix does not fill.
IMPRESSION: Pan diverticulosis of the colon

Negative for mass or polyp.

## 2018-04-10 MED ORDER — MAGNESIUM HYDROXIDE 400 MG/5ML PO SUSP
ORAL | Status: AC
  Filled 2018-04-10: qty 30

## 2018-04-21 ENCOUNTER — Other Ambulatory Visit: Payer: Self-pay | Admitting: Radiology

## 2018-04-24 ENCOUNTER — Ambulatory Visit (HOSPITAL_COMMUNITY)
Admission: RE | Admit: 2018-04-24 | Discharge: 2018-04-24 | Disposition: A | Discharge status: Home / Self Care | Payer: Medicare Other | Source: Ambulatory Visit | Attending: Gastroenterology | Admitting: Gastroenterology

## 2018-04-24 ENCOUNTER — Encounter (HOSPITAL_COMMUNITY): Payer: Self-pay

## 2018-04-24 DIAGNOSIS — Z9049 Acquired absence of other specified parts of digestive tract: Secondary | ICD-10-CM | POA: Diagnosis not present

## 2018-04-24 DIAGNOSIS — K7581 Nonalcoholic steatohepatitis (NASH): Secondary | ICD-10-CM | POA: Diagnosis not present

## 2018-04-24 DIAGNOSIS — Z6841 Body Mass Index (BMI) 40.0 and over, adult: Secondary | ICD-10-CM | POA: Insufficient documentation

## 2018-04-24 DIAGNOSIS — Z801 Family history of malignant neoplasm of trachea, bronchus and lung: Secondary | ICD-10-CM | POA: Diagnosis not present

## 2018-04-24 DIAGNOSIS — Z8249 Family history of ischemic heart disease and other diseases of the circulatory system: Secondary | ICD-10-CM | POA: Insufficient documentation

## 2018-04-24 DIAGNOSIS — Z808 Family history of malignant neoplasm of other organs or systems: Secondary | ICD-10-CM | POA: Insufficient documentation

## 2018-04-24 DIAGNOSIS — R945 Abnormal results of liver function studies: Secondary | ICD-10-CM

## 2018-04-24 DIAGNOSIS — R7989 Other specified abnormal findings of blood chemistry: Secondary | ICD-10-CM | POA: Insufficient documentation

## 2018-04-24 DIAGNOSIS — K7460 Unspecified cirrhosis of liver: Secondary | ICD-10-CM | POA: Diagnosis not present

## 2018-04-24 DIAGNOSIS — Z79890 Hormone replacement therapy (postmenopausal): Secondary | ICD-10-CM | POA: Insufficient documentation

## 2018-04-24 DIAGNOSIS — Z79899 Other long term (current) drug therapy: Secondary | ICD-10-CM | POA: Insufficient documentation

## 2018-04-24 DIAGNOSIS — E039 Hypothyroidism, unspecified: Secondary | ICD-10-CM | POA: Diagnosis not present

## 2018-04-24 DIAGNOSIS — Z8542 Personal history of malignant neoplasm of other parts of uterus: Secondary | ICD-10-CM | POA: Insufficient documentation

## 2018-04-24 DIAGNOSIS — I10 Essential (primary) hypertension: Secondary | ICD-10-CM | POA: Insufficient documentation

## 2018-04-24 LAB — CBC
HCT: 39.8 % (ref 36.0–46.0)
Hemoglobin: 12.8 g/dL (ref 12.0–15.0)
MCH: 30.8 pg (ref 26.0–34.0)
MCHC: 32.2 g/dL (ref 30.0–36.0)
MCV: 95.9 fL (ref 80.0–100.0)
Platelets: 145 10*3/uL — ABNORMAL LOW (ref 150–400)
RBC: 4.15 MIL/uL (ref 3.87–5.11)
RDW: 13.4 % (ref 11.5–15.5)
WBC: 6.5 10*3/uL (ref 4.0–10.5)
nRBC: 0 % (ref 0.0–0.2)

## 2018-04-24 LAB — PROTIME-INR
INR: 1.01
PROTHROMBIN TIME: 13.2 s (ref 11.4–15.2)

## 2018-04-24 MED ORDER — MIDAZOLAM HCL 2 MG/2ML IJ SOLN
INTRAMUSCULAR | Status: AC | PRN
  Administered 2018-04-24 (×2): 0.5 mg via INTRAVENOUS
  Administered 2018-04-24: 1 mg via INTRAVENOUS

## 2018-04-24 MED ORDER — SODIUM CHLORIDE 0.9 % IV SOLN: INTRAVENOUS | Status: DC

## 2018-04-24 MED ORDER — FENTANYL CITRATE (PF) 100 MCG/2ML IJ SOLN
INTRAMUSCULAR | Status: AC
  Filled 2018-04-24: qty 2

## 2018-04-24 MED ORDER — MIDAZOLAM HCL 2 MG/2ML IJ SOLN
INTRAMUSCULAR | Status: AC
  Filled 2018-04-24: qty 2

## 2018-04-24 MED ORDER — LIDOCAINE HCL (PF) 1 % IJ SOLN
INTRAMUSCULAR | Status: AC
  Filled 2018-04-24: qty 30

## 2018-04-24 MED ORDER — FENTANYL CITRATE (PF) 100 MCG/2ML IJ SOLN
INTRAMUSCULAR | Status: AC | PRN
  Administered 2018-04-24: 50 ug via INTRAVENOUS
  Administered 2018-04-24 (×2): 25 ug via INTRAVENOUS

## 2018-04-24 MED ORDER — GELATIN ABSORBABLE 12-7 MM EX MISC
CUTANEOUS | Status: AC
  Filled 2018-04-24: qty 1

## 2018-04-24 MED ORDER — SODIUM CHLORIDE 0.9 % IV SOLN
INTRAVENOUS | Status: AC | PRN
  Administered 2018-04-24: 10 mL/h via INTRAVENOUS

## 2018-04-24 NOTE — Procedures (Signed)
  Procedure: [place] core liver biopsy  18g x3 EBL:   minimal Complications:  none immediate  See full dictation in [employer].  Dillard Cannon MD Main # [phone] Pager  [phone]

## 2018-04-24 NOTE — Discharge Instructions (Addendum)
Liver Biopsy The liver is a large organ in the upper right-hand side of your abdomen. A liver biopsy is a procedure in which a tissue sample is taken from the liver and examined under a microscope. The procedure is done to confirm a suspected problem. There are three types of liver biopsies:  Percutaneous. In this type, an incision is made in your abdomen. The sample is removed through the incision with a needle.  Laparoscopic. In this type, several incisions are made in the abdomen. A tiny camera is passed through one of the incisions to help guide the health care provider. The sample is removed through the other incision or incisions.  Transjugular. In this type, an incision is made in the neck. A tube is passed through the incision to the liver. The sample is removed through the tube with a needle.  Tell a health care provider about:  Any allergies you have.  All medicines you are taking, including vitamins, herbs, eye drops, creams, and over-the-counter medicines.  Any problems you or family members have had with anesthetic medicines.  Any blood disorders you have.  Any surgeries you have had.  Any medical conditions you have.  Possibility of pregnancy, if this applies. What are the risks? Generally, this is a safe procedure. However, problems can occur and include:  Bleeding.  Infection.  Bruising.  Collapsed lung.  Leak of digestive juices (bile) from the liver or gallbladder.  Problems with heart rhythm.  Pain at the biopsy site or in the right shoulder.  Low blood pressure (hypotension).  Injury to nearby organs or tissues.  What happens before the procedure?  Your health care provider may do some blood or urine tests. These will help your health care provider learn how well your kidneys and liver are working and how well your blood clots.  Ask your health care provider if you will be able to go home the day of the procedure. Arrange for someone to take you  home and stay with you for at least 24 hours.  Do not eat or drink anything after midnight on the night before the procedure or as directed by your health care provider.  Ask your health care provider about: ? Changing or stopping your regular medicines. This is especially important if you are taking diabetes medicines or blood thinners. ? Taking medicines such as aspirin and ibuprofen. These medicines can thin your blood. Do not take these medicines before your procedure if your health care provider asks you not to. What happens during the procedure? Regardless of the type of biopsy that will be done, you will have an IV line placed. Through this line, you will receive fluids and medicine to relax you. If you will be having a laparoscopic biopsy, you may also receive medicine through this line to make you sleep during the procedure (general anesthetic). Percutaneous Liver Biopsy  You will positioned on your back, with your right hand over your head.  A health care provider will locate your liver by tapping and pressing on the right side of your abdomen or with the help of an ultrasound machine or CT scan.  An area at the bottom of your last right rib will be numbed.  An incision will be made in the numbed area.  The biopsy needle will be inserted into the incision.  Several samples of liver tissue will be taken with the biopsy needle. You will be asked to hold your breath as each sample is taken. Laparoscopic Liver  Biopsy  You will be positioned on your back.  Several small incisions will be made in your abdomen.  Your doctor will pass a tiny camera through one incision. The camera will allow the liver to be viewed on a TV monitor in the operating room.  Tools will be passed through the other incision or incisions. These tools will be used to remove samples of liver tissue. Transjugular Liver Biopsy  You will be positioned on your back on an X-ray table, with your head turned to  your left.  An area on your neck just over your jugular vein will be numbed.  An incision will be made in the numbed area.  A tiny tube will be inserted through the incision. It will be pushed through the jugular vein to a blood vessel in the liver called the hepatic vein.  Dye will be inserted through the tube, and X-rays will be taken. The dye will make the blood vessels in the liver light up on the X-rays.  The biopsy needle will be pushed through the tube until it reaches the liver.  Samples of liver tissue will be taken with the biopsy needle.  The needle and the tube will be removed. After the samples are obtained, the incision or incisions will be closed. What happens after the procedure?  You will be taken to a recovery area.  You may have to lie on your right side for 1-2 hours. This will prevent bleeding from the biopsy site.  Your progress will be watched. Your blood pressure, pulse, and the biopsy site will be checked often.  You may have some pain or feel sick. If this happens, tell your health care provider.  As you begin to feel better, you will be offered ice and beverages.  You may be allowed to go home when the medicines have worn off and you can walk, drink, eat, and use the bathroom. This information is not intended to replace advice given to you by your health care provider. Make sure you discuss any questions you have with your health care provider. Document Released: 08/07/2003 Document Revised: 10/20/2015 Document Reviewed: 07/13/2013 Elsevier Interactive Patient Education  2018 [place]. Moderate Conscious Sedation, Adult, Care After These instructions provide you with information about caring for yourself after your procedure. Your health care provider may also give you more specific instructions. Your treatment has been planned according to current medical practices, but problems sometimes occur. Call your health care provider if you have any problems  or questions after your procedure. What can I expect after the procedure? After your procedure, it is common:  To feel sleepy for several hours.  To feel clumsy and have poor balance for several hours.  To have poor judgment for several hours.  To vomit if you eat too soon.  Follow these instructions at home: For at least 24 hours after the procedure:   Do not: ? Participate in activities where you could fall or become injured. ? Drive. ? Use heavy machinery. ? Drink alcohol. ? Take sleeping pills or medicines that cause drowsiness. ? Make important decisions or sign legal documents. ? Take care of children on your own.  Rest. Eating and drinking  Follow the diet recommended by your health care provider.  If you vomit: ? Drink water, juice, or soup when you can drink without vomiting. ? Make sure you have little or no nausea before eating solid foods. General instructions  Have a responsible adult stay with you until  you are awake and alert.  Take over-the-counter and prescription medicines only as told by your health care provider.  If you smoke, do not smoke without supervision.  Keep all follow-up visits as told by your health care provider. This is important. Contact a health care provider if:  You keep feeling nauseous or you keep vomiting.  You feel light-headed.  You develop a rash.  You have a fever. Get help right away if:  You have trouble breathing. This information is not intended to replace advice given to you by your health care provider. Make sure you discuss any questions you have with your health care provider. Document Released: 03/07/2013 Document Revised: 10/20/2015 Document Reviewed: 09/06/2015 Elsevier Interactive Patient Education  [employer].

## 2018-04-24 NOTE — H&P (Signed)
Chief Complaint: Patient was seen in consultation today for random liver biopsy.  Referring Physician(s): Stark,Malcolm T  Supervising Physician: Arne Cleveland  Patient Status: [facility] - Out-pt  History of Present Illness: Claudia Weber is a 67 y.o. female with a past medical history significant for HTN, hypothyroidism, morbid obesity and uterina cancer s/p TAH/BSO (July 2018). She was recently seen by Dr. Fuller Plan with [facility] due to a CT abdomen from July 2019 which showed diffuse irregular liver with relative hypertrophy of the lateral segment of the left liver lobe compatible with hepatic cirrhosis. Additionally, she reports that she has been told several times over the years that her liver function was slightly high although not cause for concern. She underwent screening colonoscopy and EGD in September of this year which were essentially normal. She presents today for random liver biopsy for further evaluation.  Patient reports she feels fine overall, although she is anxious that the procedure will be painful. She states understanding of procedure to be performed and wishes to proceed.   Past Medical History:  Diagnosis Date  . Cat scratch fever   . High blood pressure   . Other specified disorders of thyroid   . Uterine cancer [facility])     Past Surgical History:  Procedure Laterality Date  . ABDOMINAL HYSTERECTOMY  11/2016  . BLADDER SURGERY     polyp removed  . Breast cyst removal    . CHOLECYSTECTOMY    . KNEE SURGERY Left    arthroscopic  . right knee surgery      Allergies: Codeine  Medications: Prior to Admission medications   Medication Sig Start Date End Date Taking? Authorizing Provider  levothyroxine (SYNTHROID, LEVOTHROID) 50 MCG tablet Take 50 mcg by mouth daily before breakfast.   Yes [provider]  lisinopril (PRINIVIL,ZESTRIL) 20 MG tablet Take 20 mg by mouth daily.   Yes [provider]     Family History  Problem Relation Age of  Onset  . Throat cancer Mother   . Lung cancer Mother   . Colitis Mother        resection  . Hepatitis Brother   . Heart disease Maternal Grandmother   . Heart disease Maternal Grandfather   . Uterine cancer Sister   . Liver cancer Cousin   . Leukemia Cousin   . Lung cancer Cousin     Social History   Socioeconomic History  . Marital status: Married    Spouse name: Not on file  . Number of children: 2  . Years of education: Not on file  . Highest education level: Not on file  Occupational History  . Occupation: retired  Scientific laboratory technician  . Financial resource strain: Not on file  . Food insecurity:    Worry: Not on file    Inability: Not on file  . Transportation needs:    Medical: Not on file    Non-medical: Not on file  Tobacco Use  . Smoking status: Never Smoker  . Smokeless tobacco: Never Used  Substance and Sexual Activity  . Alcohol use: Not Currently  . Drug use: Never  . Sexual activity: Not on file  Lifestyle  . Physical activity:    Days per week: Not on file    Minutes per session: Not on file  . Stress: Not on file  Relationships  . Social connections:    Talks on phone: Not on file    Gets together: Not on file    Attends religious  service: Not on file    Active member of club or organization: Not on file    Attends meetings of clubs or organizations: Not on file    Relationship status: Not on file  Other Topics Concern  . Not on file  Social History Narrative  . Not on file     Review of Systems: A 12 point ROS discussed and pertinent positives are indicated in the HPI above.  All other systems are negative.  Review of Systems  Constitutional: Negative for appetite change, chills, fatigue and fever.  Respiratory: Negative for cough and shortness of breath.   Cardiovascular: Negative for chest pain.  Gastrointestinal: Negative for abdominal distention, abdominal pain, diarrhea, nausea and vomiting.  Skin: Positive for wound (right hip -  previous biopsy). Negative for color change and rash.  Neurological: Negative for dizziness, syncope, weakness and numbness.  Psychiatric/Behavioral: Negative for confusion.    Vital Signs: BP (!) 166/83   Pulse 76   Temp 98 F (36.7 C) (Oral)   Resp 16   Ht 5' 5"  (1.651 m)   Wt 283 lb (128.4 kg)   SpO2 98%   BMI 47.09 kg/m   Physical Exam  Constitutional: She is oriented to person, place, and time. No distress.  HENT:  Head: Normocephalic.  Eyes: No scleral icterus.  Cardiovascular: Normal rate, regular rhythm and normal heart sounds.  Pulmonary/Chest: Effort normal and breath sounds normal.  Abdominal: Soft. She exhibits no distension. There is no tenderness.  Neurological: She is alert and oriented to person, place, and time.  Skin: Skin is warm and dry. She is not diaphoretic.  Psychiatric: She has a normal mood and affect. Her behavior is normal. Judgment and thought content normal.  Nursing note and vitals reviewed.    MD Evaluation Airway: WNL Heart: WNL Abdomen: WNL Chest/ Lungs: WNL ASA  Classification: 2 Mallampati/Airway Score: Two   Imaging: Dg Colon Alonza Bogus Hi Den Cm  Result Date: 04/10/2018 CLINICAL DATA:  Screening for colon neoplasm. Incomplete colonoscopy EXAM: AIR CONTRAST BARIUM ENEMA TECHNIQUE: Initial scout AP supine abdominal image obtained to insure adequate colon cleansing. Barium was introduced into the colon in a retrograde fashion and refluxed from the rectum to the distal transverse colon. As much of the barium as possible was then removed through the indwelling tube via gravity drain. Air was then insufflated into the colon. Spot images of the colon followed by overhead radiographs were obtained. FLUOROSCOPY TIME:  Fluoroscopy Time:  2 minutes 6 second Radiation Exposure Index (if provided by the fluoroscopic device): Number of Acquired Spot Images: 0 COMPARISON:  CT abdomen pelvis 12/06/2017 FINDINGS: Preliminary KUB negative The entire colon  was evaluated with reflux into the terminal ileum. Pan diverticulosis throughout the colon. Scattered diverticular present diffusely, most prominent in the sigmoid colon. Negative for stricture or mass. Negative for colonic obstruction. Normal terminal ileum. Appendix does not fill. IMPRESSION: Pan diverticulosis of the colon Negative for mass or polyp. Electronically Signed   By: Franchot Gallo M.D.   On: 04/10/2018 11:54    Labs:  CBC: Recent Labs    04/24/18 0557  WBC 6.5  HGB 12.8  HCT 39.8  PLT 145*    COAGS: Recent Labs    12/19/17 1604 04/24/18 0557  INR 1.2* 1.01    BMP: No results for input(s): NA, K, CL, CO2, GLUCOSE, BUN, CALCIUM, CREATININE, GFRNONAA, GFRAA in the last 8760 hours.  Invalid input(s): CMP  LIVER FUNCTION TESTS: Recent Labs  03/17/18 1029  BILITOT 1.6*  AST 39*  ALT 37*  ALKPHOS 93  PROT 7.6  ALBUMIN 3.8    TUMOR MARKERS: Recent Labs    12/19/17 1604  AFPTM 7.8*    Assessment and Plan:  Patient with previously elevated LFTs and recent CT compatible with cirrhosis now followed by Dr. Fuller Plan. Request has been made to IR for random liver biopsy to further evaluate.   Patient has been NPO since 8 pm last night, she does not take blood thinning medications. Afebrile, WBC 6.5, H/H 12.8/39.8, INR 1.01.  Risks and benefits discussed with the patient including, but not limited to bleeding, infection, damage to adjacent structures or low yield requiring additional tests.  All of the patient's questions were answered, patient is agreeable to proceed.  Consent signed and in chart.  Thank you for this interesting consult.  I greatly enjoyed meeting Claudia Weber and look forward to participating in their care.  A copy of this report was sent to the requesting provider on this date.  Electronically Signed: Joaquim Nam, PA-C 04/24/2018, 7:32 AM   I spent a total of  15 Minutes  in face to face in clinical consultation, greater than  50% of which was counseling/coordinating care for liver biopsy.

## 2018-06-06 ENCOUNTER — Encounter: Payer: Self-pay | Admitting: Gastroenterology

## 2018-06-06 ENCOUNTER — Ambulatory Visit (INDEPENDENT_AMBULATORY_CARE_PROVIDER_SITE_OTHER): Payer: Medicare Other | Admitting: Gastroenterology

## 2018-06-06 VITALS — BP 124/84 | HR 76 | Ht 65.0 in | Wt 277.0 lb

## 2018-06-06 DIAGNOSIS — K7460 Unspecified cirrhosis of liver: Secondary | ICD-10-CM | POA: Diagnosis not present

## 2018-06-06 DIAGNOSIS — Z23 Encounter for immunization: Secondary | ICD-10-CM | POA: Diagnosis not present

## 2018-06-06 DIAGNOSIS — K7581 Nonalcoholic steatohepatitis (NASH): Secondary | ICD-10-CM | POA: Diagnosis not present

## 2018-06-06 NOTE — Patient Instructions (Signed)
We have given you your first Twinrix injection. Your next injection is on 06/13/18 at 10:00am with the nurse.  Do no exceed more than 2 grams of Tylenol a day.   Do not drink any alcohol.   Start a weight loss program with a fat modified diet.   Normal BMI (Body Mass Index- based on height and weight) is between 19 and 25. Your BMI today is Body mass index is 46.1 kg/m. Marland Kitchen Please consider follow up  regarding your BMI with your Primary Care Provider.  Thank you for choosing me and [facility].  Pricilla Riffle. Dagoberto Ligas., MD., Marval Regal

## 2018-06-06 NOTE — Progress Notes (Signed)
    History of Present Illness: This is a 68 year old female with NASH cirrhosis. EGD in 01/2018 no varices.  She is accompanied by 2 family members.  She wanted to review the results of her liver biopsy and discuss her disease.  She has no gastrointestinal complaints.  She states she is following a fat modified, low Na, weight loss diet.  Liver, needle/core biopsy 03/2018 - MODERATELY ACTIVE STEATOHEPATITIS (GRADE 2 OF 3; IF DETERMINED TO BE NAFLD, THEN THE NAS = 4 OF 8). - CIRRHOSIS (STAGE 4 OF 4) - SEE NOTE  Current Medications, Allergies, Past Medical History, Past Surgical History, Family History and Social History were reviewed in [employer] record.  Physical Exam: General: Well developed, well nourished, obese, no acute distress Head: Normocephalic and atraumatic Eyes:  sclerae anicteric, EOMI Ears: Normal auditory acuity Mouth: No deformity or lesions Lungs: Clear throughout to auscultation Heart: Regular rate and rhythm; no murmurs, rubs or bruits Abdomen: Soft, non tender and non distended. No masses, hepatosplenomegaly or hernias noted. Normal Bowel sounds Rectal: Not done Musculoskeletal: Symmetrical with no gross deformities  Pulses:  Normal pulses noted Extremities: No clubbing, cyanosis, edema or deformities noted Neurological: Alert oriented x 4, grossly nonfocal Psychological:  Alert and cooperative. Normal mood and affect   Assessment and Recommendations:  1.  Cirrhosis, moderately active steatohepatitis. Long term weight loss, fat modified diet, low Na supervised by PCP. No more than 2g Tylenol daily. Do not start drinking alcohol. Hep A, B vaccination.  We discussed the disease process and its prognosis in detail.  She has early stage cirrhosis, without  decompensation.  I addressed her questions and her family's questions to their satisfaction.  The patient expressed appreciation.  REV in 1 year.   2. Obesity BMI=46 as above.   I spent 15  minutes of face-to-face time with the patient. Greater than 50% of the time was spent counseling and coordinating care.

## 2018-06-13 ENCOUNTER — Ambulatory Visit (INDEPENDENT_AMBULATORY_CARE_PROVIDER_SITE_OTHER): Payer: Medicare Other | Admitting: Gastroenterology

## 2018-06-13 DIAGNOSIS — Z23 Encounter for immunization: Secondary | ICD-10-CM

## 2018-06-27 ENCOUNTER — Ambulatory Visit (INDEPENDENT_AMBULATORY_CARE_PROVIDER_SITE_OTHER): Payer: Medicare Other | Admitting: Gastroenterology

## 2018-06-27 DIAGNOSIS — K7460 Unspecified cirrhosis of liver: Secondary | ICD-10-CM | POA: Diagnosis not present

## 2018-06-27 DIAGNOSIS — Z23 Encounter for immunization: Secondary | ICD-10-CM | POA: Diagnosis not present

## 2019-04-15 ENCOUNTER — Encounter (HOSPITAL_COMMUNITY): Payer: Self-pay | Admitting: Emergency Medicine

## 2019-04-15 ENCOUNTER — Emergency Department (HOSPITAL_COMMUNITY): Payer: Medicare Other

## 2019-04-15 ENCOUNTER — Other Ambulatory Visit: Payer: Self-pay

## 2019-04-15 ENCOUNTER — Emergency Department (HOSPITAL_COMMUNITY)
Admission: EM | Admit: 2019-04-15 | Discharge: 2019-04-15 | Disposition: A | Discharge status: Home / Self Care | Payer: Medicare Other | Attending: Emergency Medicine | Admitting: Emergency Medicine

## 2019-04-15 DIAGNOSIS — R0789 Other chest pain: Secondary | ICD-10-CM | POA: Diagnosis not present

## 2019-04-15 DIAGNOSIS — K5792 Diverticulitis of intestine, part unspecified, without perforation or abscess without bleeding: Secondary | ICD-10-CM | POA: Insufficient documentation

## 2019-04-15 DIAGNOSIS — Z79899 Other long term (current) drug therapy: Secondary | ICD-10-CM | POA: Insufficient documentation

## 2019-04-15 DIAGNOSIS — E669 Obesity, unspecified: Secondary | ICD-10-CM | POA: Insufficient documentation

## 2019-04-15 DIAGNOSIS — C55 Malignant neoplasm of uterus, part unspecified: Secondary | ICD-10-CM | POA: Insufficient documentation

## 2019-04-15 DIAGNOSIS — R1032 Left lower quadrant pain: Secondary | ICD-10-CM | POA: Insufficient documentation

## 2019-04-15 LAB — CBC
HCT: 40.2 % (ref 36.0–46.0)
Hemoglobin: 13.6 g/dL (ref 12.0–15.0)
MCH: 31 pg (ref 26.0–34.0)
MCHC: 33.8 g/dL (ref 30.0–36.0)
MCV: 91.6 fL (ref 80.0–100.0)
Platelets: 158 10*3/uL (ref 150–400)
RBC: 4.39 MIL/uL (ref 3.87–5.11)
RDW: 12.8 % (ref 11.5–15.5)
WBC: 10 10*3/uL (ref 4.0–10.5)
nRBC: 0 % (ref 0.0–0.2)

## 2019-04-15 LAB — BASIC METABOLIC PANEL
Anion gap: 11 (ref 5–15)
BUN: 10 mg/dL (ref 8–23)
CO2: 23 mmol/L (ref 22–32)
Calcium: 9.4 mg/dL (ref 8.9–10.3)
Chloride: 103 mmol/L (ref 98–111)
Creatinine, Ser: 0.69 mg/dL (ref 0.44–1.00)
GFR calc Af Amer: >60 mL/min (ref >60)
GFR calc non Af Amer: >60 mL/min (ref >60)
Glucose, Bld: 125 mg/dL — ABNORMAL HIGH (ref 70–99)
Potassium: 3.5 mmol/L (ref 3.5–5.1)
Sodium: 137 mmol/L (ref 135–145)

## 2019-04-15 LAB — URINALYSIS, ROUTINE W REFLEX MICROSCOPIC
Bilirubin Urine: NEGATIVE
Glucose, UA: NEGATIVE mg/dL
Hgb urine dipstick: NEGATIVE
Ketones, ur: NEGATIVE mg/dL
Leukocytes,Ua: NEGATIVE
Nitrite: NEGATIVE
Protein, ur: NEGATIVE mg/dL
Specific Gravity, Urine: 1.011 (ref 1.005–1.030)
pH: 6 (ref 5.0–8.0)

## 2019-04-15 LAB — HEPATIC FUNCTION PANEL
ALT: 29 U/L (ref 0–44)
AST: 32 U/L (ref 15–41)
Albumin: 3.5 g/dL (ref 3.5–5.0)
Alkaline Phosphatase: 81 U/L (ref 38–126)
Bilirubin, Direct: 0.3 mg/dL — ABNORMAL HIGH (ref 0.0–0.2)
Indirect Bilirubin: 2.5 mg/dL — ABNORMAL HIGH (ref 0.3–0.9)
Total Bilirubin: 2.8 mg/dL — ABNORMAL HIGH (ref 0.3–1.2)
Total Protein: 7.4 g/dL (ref 6.5–8.1)

## 2019-04-15 LAB — TROPONIN I (HIGH SENSITIVITY)
Troponin I (High Sensitivity): 3 ng/L (ref <18)
Troponin I (High Sensitivity): 5 ng/L (ref <18)

## 2019-04-15 IMAGING — CT CT ABD-PELV W/ CM
2 of 5 series · 16 of 46 positions shown, 18 images · IV contrast (APPLIED)
Comparison: [DATE] and prior studies dating back to [[place]]

CLINICAL DATA: Abdominal pain, suspected diverticulitis.

EXAM:
CT ABDOMEN AND PELVIS WITH CONTRAST
TECHNIQUE: Multidetector CT imaging of the abdomen and pelvis was performed
using the standard protocol following bolus administration of
intravenous contrast.
CONTRAST:  100mL OMNIPAQUE IOHEXOL 300 MG/ML  SOLN

[Series 3: abdomen 5.0 · axial · 0.97mm/px · z∈[-376,+39]mm · 13 of 97 slices shown, 15 images]
[im 7/97  soft-tissue]
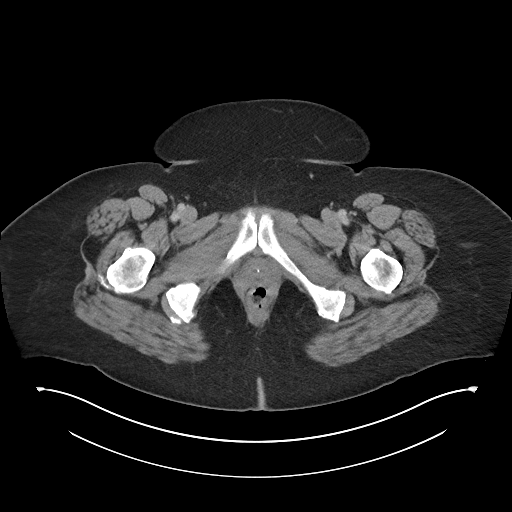
[im 7/97  bone]
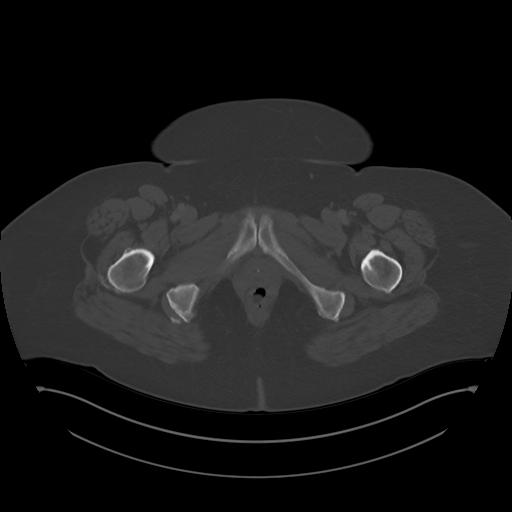
[im 14/97  soft-tissue]
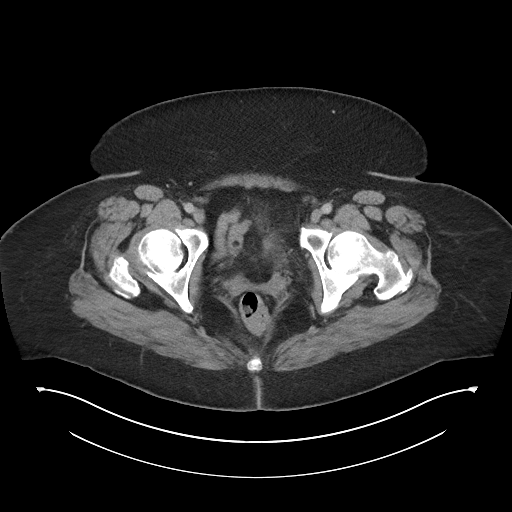
[im 21/97  soft-tissue]
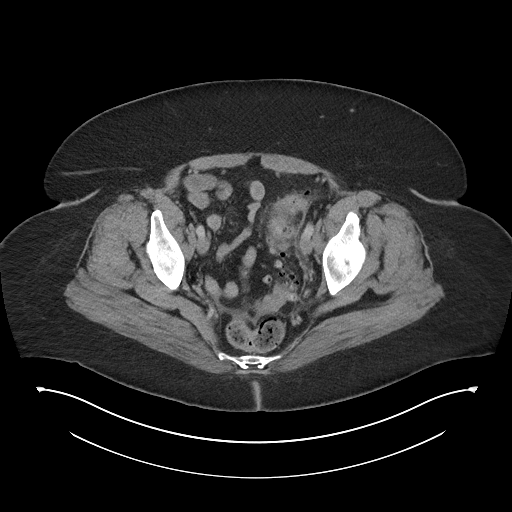
[im 28/97  soft-tissue]
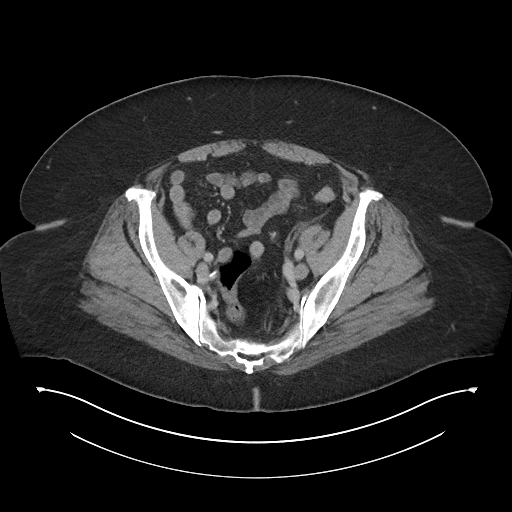
[im 35/97  soft-tissue]
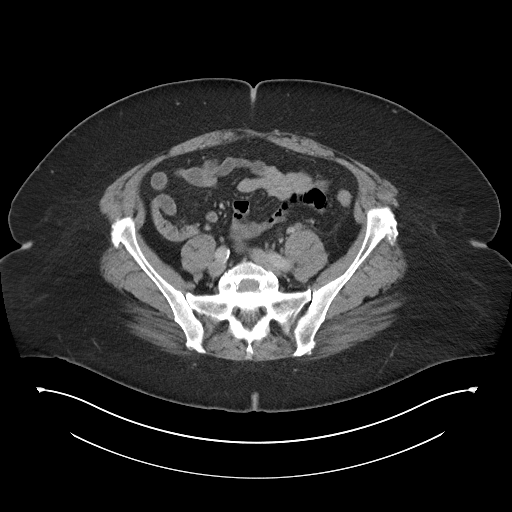
[im 42/97  soft-tissue]
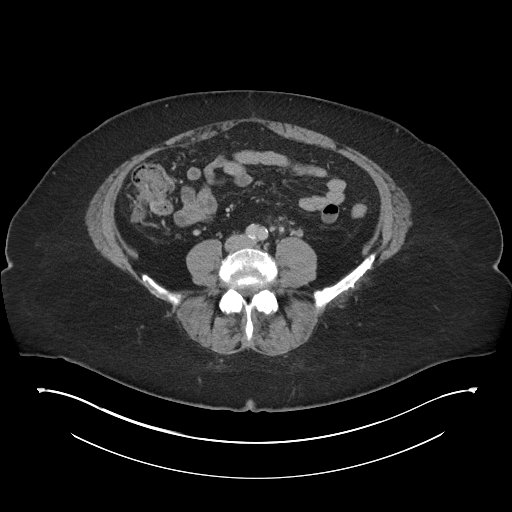
[im 49/97  soft-tissue]
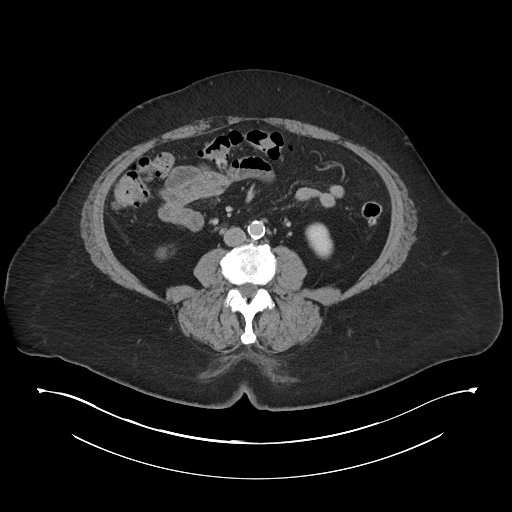
[im 55/97  soft-tissue]
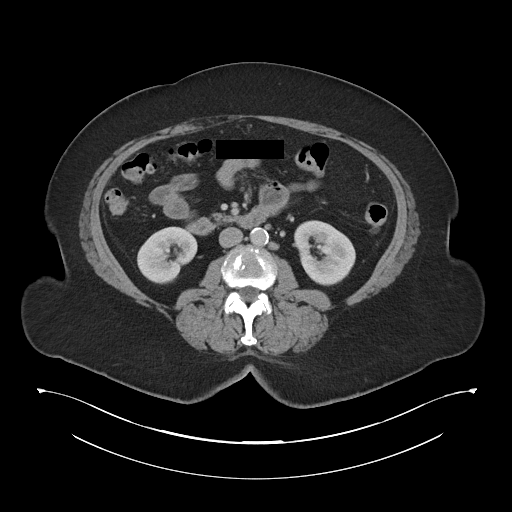
[im 62/97  soft-tissue]
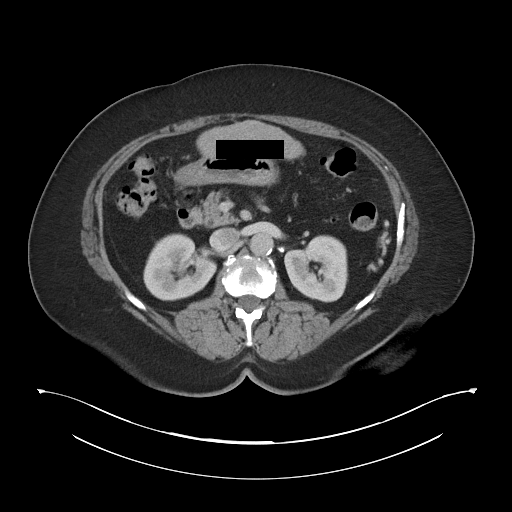
[im 62/97  bone]
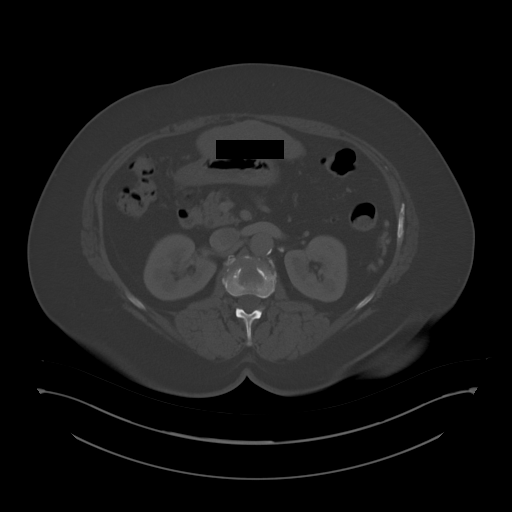
[im 69/97  soft-tissue]
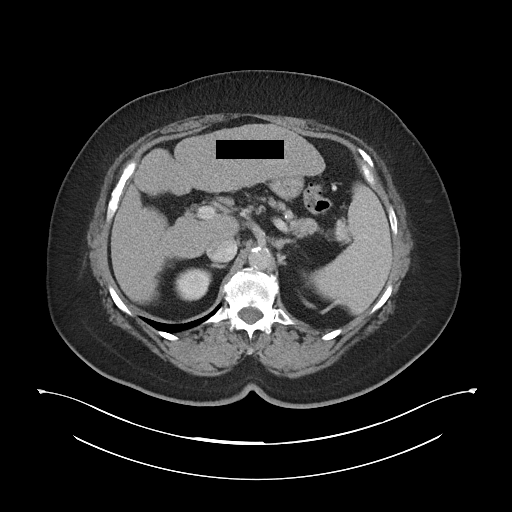
[im 76/97  soft-tissue]
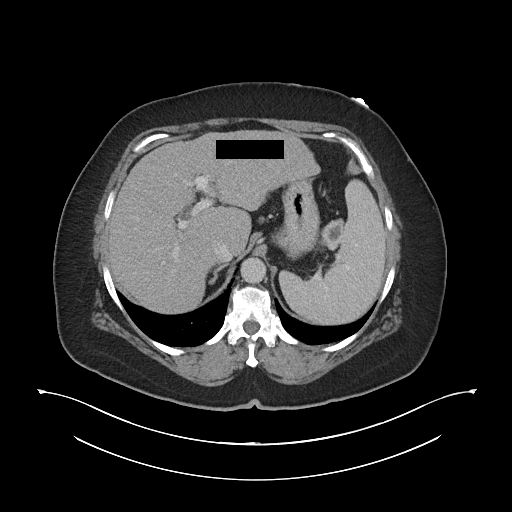
[im 83/97  soft-tissue]
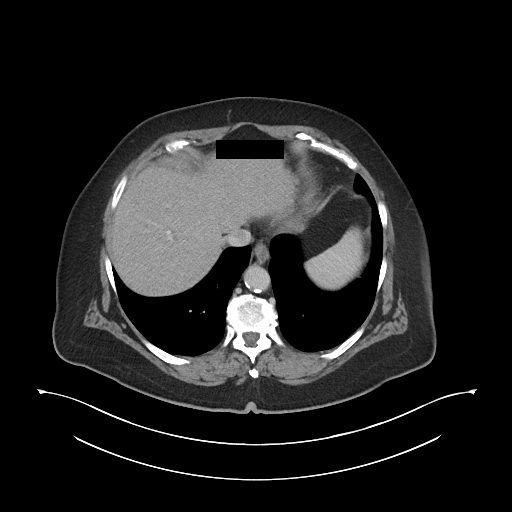
[im 90/97  soft-tissue]
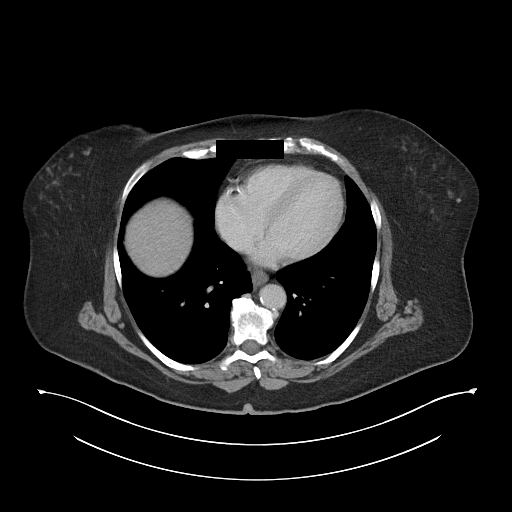

[Series 6: abdomen 3.0 mpr cor · coronal · 0.95mm/px · 3 of 116 slices shown]
[im 39/116  soft-tissue]
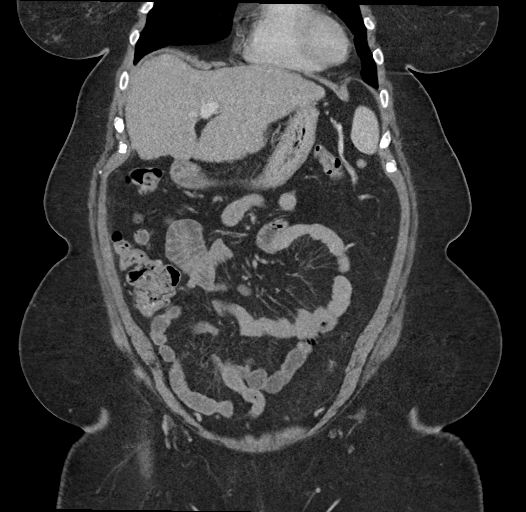
[im 52/116  soft-tissue]
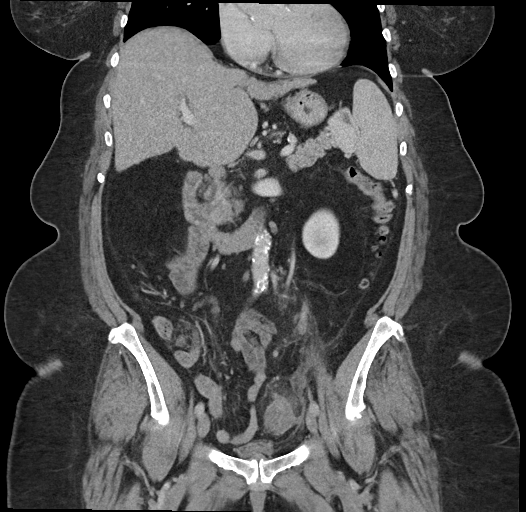
[im 64/116  soft-tissue]
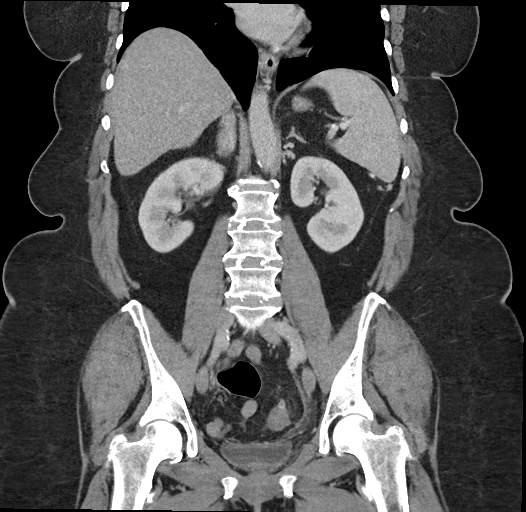

[16 of 46 positions shown; findings below may reference images not displayed]

FINDINGS: Lower chest: No signs of pleural effusion or consolidation.

Hepatobiliary: Lobular hepatic contours. No signs of focal hepatic
lesion. Fissural widening is noted.

Pancreas: Stable 4.2 x 2.5 cm lesion in the pancreatic tail/splenic
hilum not changed since [TX]. The remainder of pancreas is normal.

Spleen: Splenomegaly as before.

Adrenals/Urinary Tract: Normal adrenal glands. Kidneys enhance
symmetrically without focal lesion.

Stomach/Bowel: No signs of small bowel abnormality.

Signs of acute diverticulitis affecting a segment of the sigmoid
colon. No signs of pericolonic abscess. Pericolonic stranding
extends to the left pelvic sidewall. No signs of free air in the
abdomen.

Vascular/Lymphatic: Patent abdominal vasculature. Moderate calcific
atherosclerotic changes throughout the abdominal aorta. No signs of
aneurysm. No signs of adenopathy.

Reproductive: Post hysterectomy.

Other: No signs of abscess, ascites or free air.

Musculoskeletal: No signs of acute bone process. Spinal and hip
degenerative changes.
IMPRESSION: 1. Signs of acute diverticulitis affecting a segment of the sigmoid
colon. No pericolonic abscess. Segmental colonic thickening likely
related to diverticular disease. Consider follow-up colonoscopy or
correlation with recent colonoscopy results if available.
2. Stable 4.2 x 2.5 cm lesion in the pancreatic tail/splenic hilum.
This may represent a benign finding or indolent neoplasm. A
neuroendocrine tumor could have this appearance. Consider
biochemical correlation with further imaging with MRI as warranted
in this patient with cirrhosis and signs of portal hypertension.
3. Atherosclerosis.

Aortic Atherosclerosis ([TX]-[TX]).

## 2019-04-15 IMAGING — DX DG CHEST 2V
2 series · 2 of 2 positions shown · non-contrast
Comparison: [DATE]

CLINICAL DATA: Central chest pain and mild shortness of breath for
the past 1.5 hours.

EXAM:
CHEST - 2 VIEW

[chest pa]
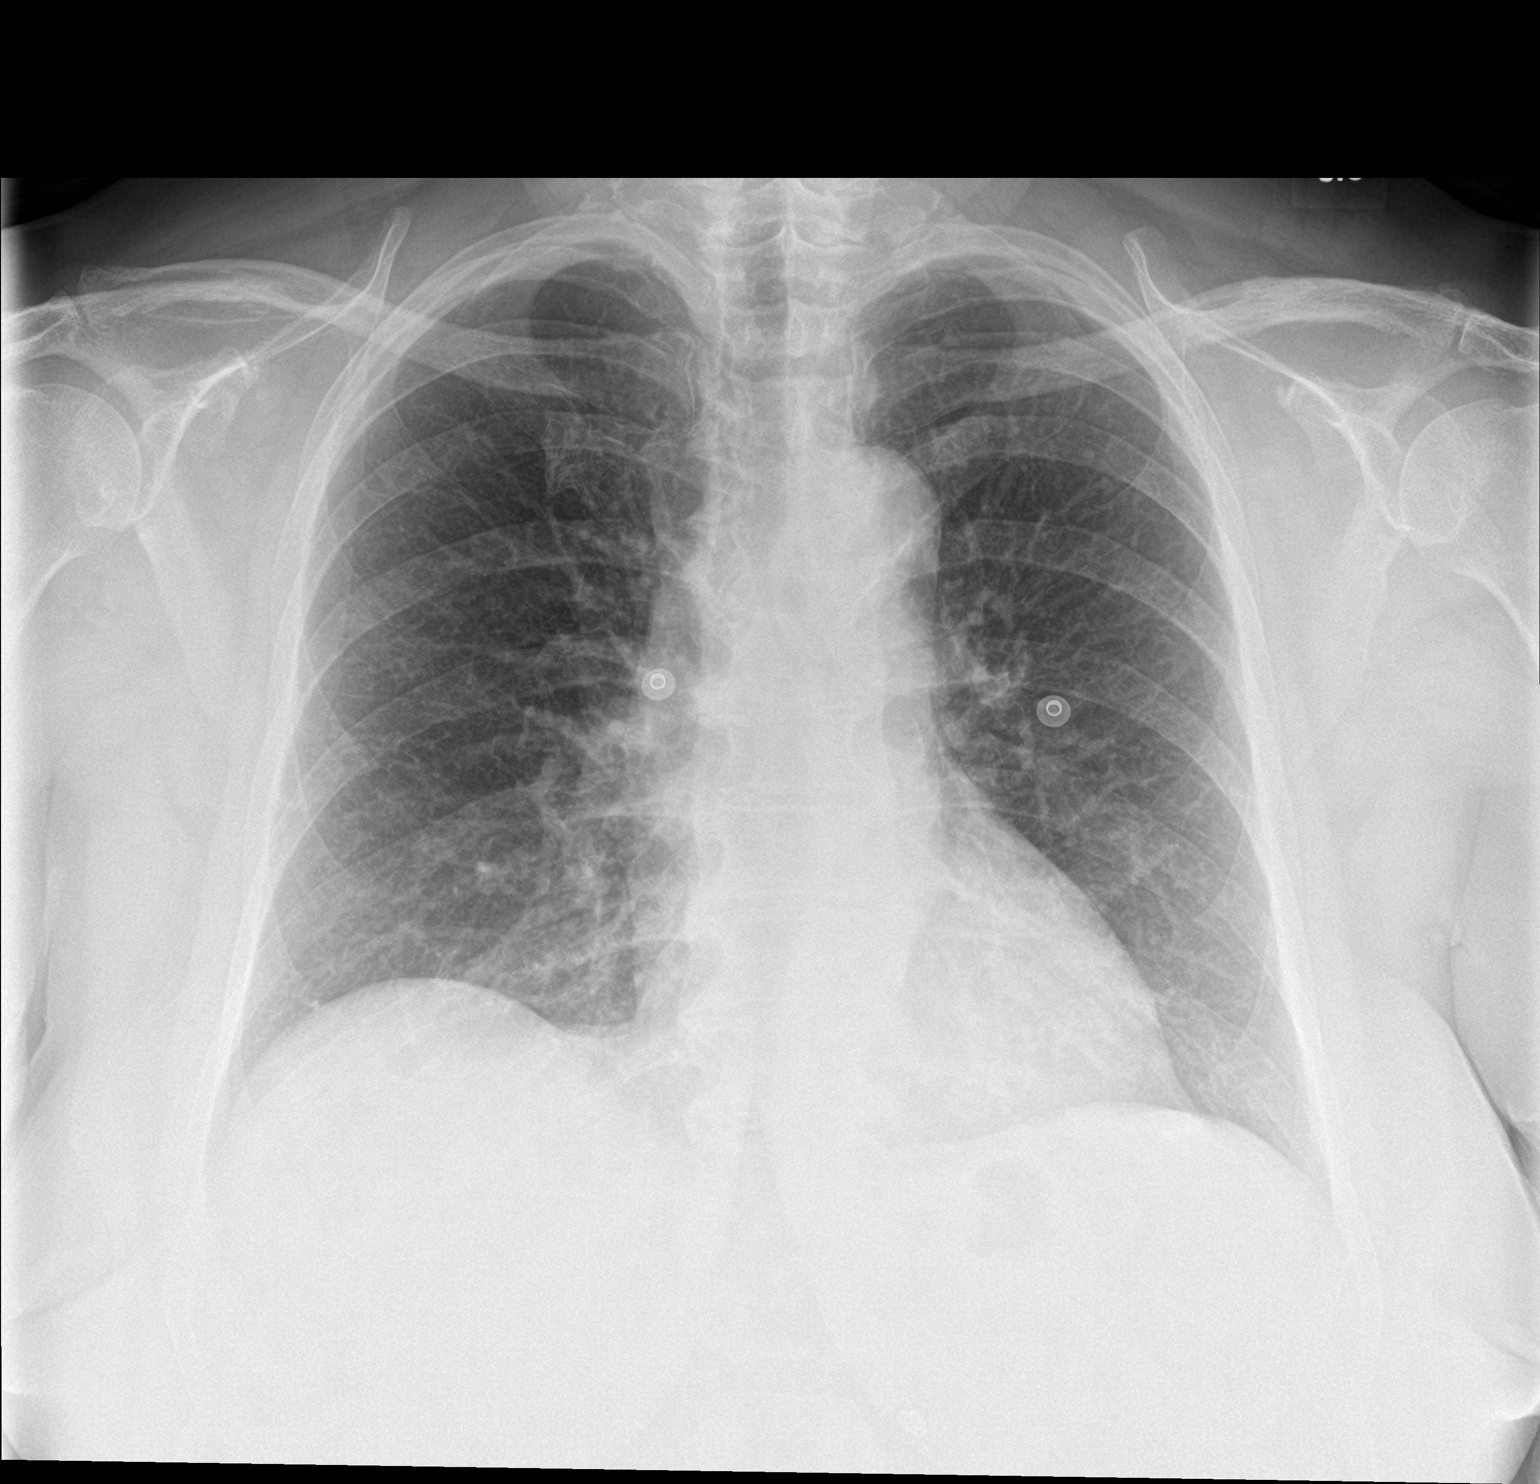

[chest lat]
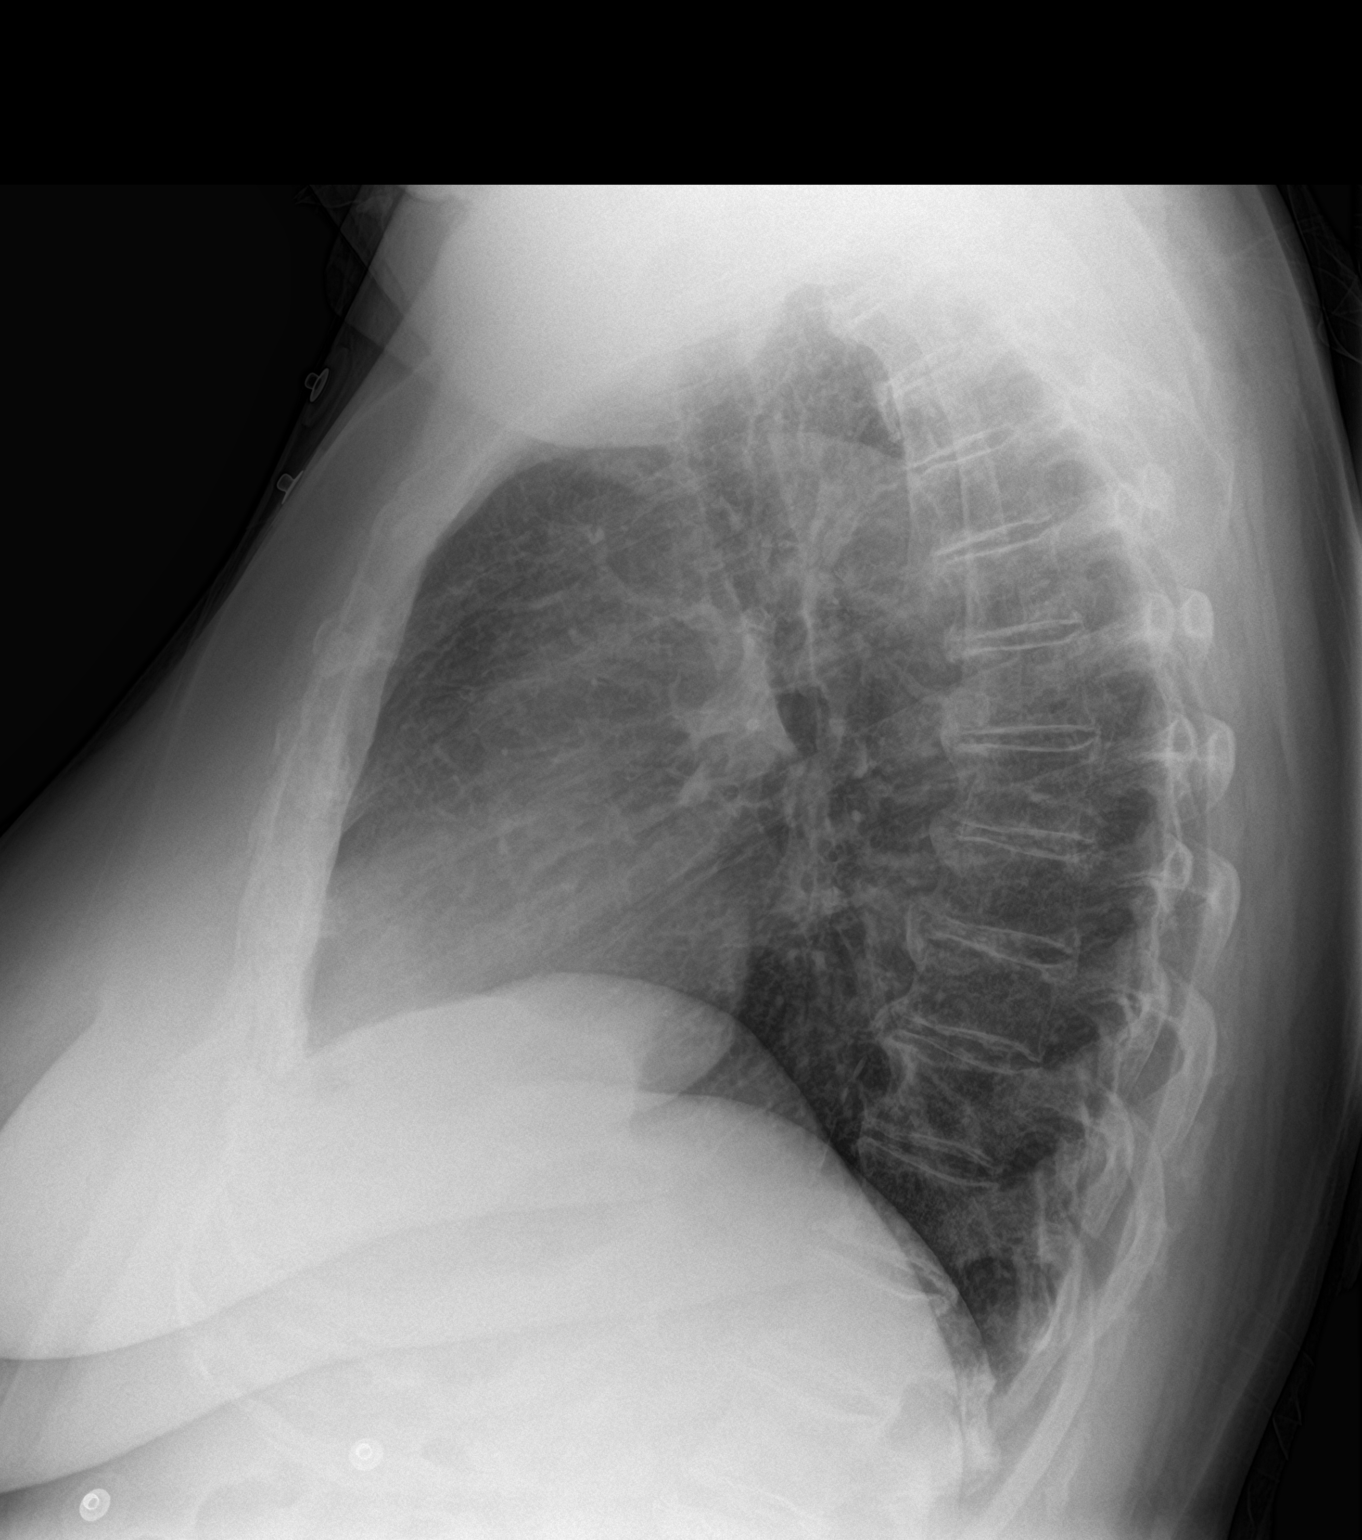

[2 of 2 positions shown; findings below may reference images not displayed]

FINDINGS: Normal sized heart. Tortuous and mildly calcified thoracic aorta.
Stable mild diffuse prominence of the interstitial markings with
normal vascularity. No pleural fluid. Thoracic and cervical spine
degenerative changes.
IMPRESSION: No acute abnormality. Stable mild chronic interstitial lung disease.

## 2019-04-15 MED ORDER — MORPHINE SULFATE (PF) 4 MG/ML IV SOLN
4.0000 mg | Freq: Once | INTRAVENOUS | Status: AC
  Administered 2019-04-15: 4 mg via INTRAVENOUS
  Filled 2019-04-15: qty 1

## 2019-04-15 MED ORDER — SODIUM CHLORIDE 0.9% FLUSH
3.0000 mL | Freq: Once | INTRAVENOUS | Status: AC
  Administered 2019-04-15: 3 mL via INTRAVENOUS

## 2019-04-15 MED ORDER — CIPROFLOXACIN HCL 500 MG PO TABS: 500.0000 mg | ORAL_TABLET | Freq: Two times a day (BID) | ORAL | 0 refills | Status: AC

## 2019-04-15 MED ORDER — METRONIDAZOLE 500 MG PO TABS
500.0000 mg | ORAL_TABLET | Freq: Once | ORAL | Status: AC
  Administered 2019-04-15: 500 mg via ORAL
  Filled 2019-04-15: qty 1

## 2019-04-15 MED ORDER — OMEPRAZOLE 20 MG PO CPDR: 20.0000 mg | DELAYED_RELEASE_CAPSULE | Freq: Every day | ORAL | 0 refills | Status: DC

## 2019-04-15 MED ORDER — CIPROFLOXACIN HCL 500 MG PO TABS
500.0000 mg | ORAL_TABLET | Freq: Once | ORAL | Status: AC
  Administered 2019-04-15: 500 mg via ORAL
  Filled 2019-04-15: qty 1

## 2019-04-15 MED ORDER — IOHEXOL 300 MG/ML  SOLN
100.0000 mL | Freq: Once | INTRAMUSCULAR | Status: AC | PRN
  Administered 2019-04-15: 100 mL via INTRAVENOUS

## 2019-04-15 MED ORDER — METRONIDAZOLE 500 MG PO TABS: 500.0000 mg | ORAL_TABLET | Freq: Three times a day (TID) | ORAL | 0 refills | Status: AC

## 2019-04-15 MED ORDER — KETOROLAC TROMETHAMINE 15 MG/ML IJ SOLN
15.0000 mg | Freq: Once | INTRAMUSCULAR | Status: DC
  Filled 2019-04-15: qty 1

## 2019-04-15 NOTE — ED Provider Notes (Signed)
[facility] EMERGENCY DEPARTMENT Provider Note   CSN: [id number] Arrival date & time: 04/15/19  1201     History   Chief Complaint Chief Complaint  Patient presents with   Chest Pain    HPI Claudia Weber is a 68 y.o. female.     Patient is a 68 year old female with past medical history of nonalcoholic cirrhosis, diverticulosis presenting to the emergency department for abdominal pain and chest discomfort.  Patient reports that for the past 2 days she felt like her diverticulitis was flaring up because she began to have increasing left lower quadrant pain which is consistent with her diverticulitis.  No fever, no diarrhea, no blood in her stool, no vomiting.  She reports that she then began to have a bad headache.  She then reports that today about 2 hours ago she felt a sharp pain in the middle of her chest with tightness radiating into her throat.  This lasted for a couple of minutes and went away on its own and then returned but then went away again.  Reports that she came in because she was concerned that there might be a bad infection somewhere.  She denies any shortness of breath, she does not have any cardiac history.     Past Medical History:  Diagnosis Date   Cat scratch fever    High blood pressure    Other specified disorders of thyroid    Uterine cancer [facility])     Patient Active Problem List   Diagnosis Date Noted   Steatohepatitis, non-alcoholic 38/46/6599   Cirrhosis of liver without ascites ([place]) 06/06/2018    Past Surgical History:  Procedure Laterality Date   ABDOMINAL HYSTERECTOMY  11/2016   BLADDER SURGERY     polyp removed   Breast cyst removal     CHOLECYSTECTOMY     KNEE SURGERY Left    arthroscopic   LIVER BIOPSY     right knee surgery       OB History   No obstetric history on file.      Home Medications    Prior to Admission medications   Medication Sig Start Date End Date Taking? Authorizing Provider   acetaminophen (TYLENOL) 500 MG tablet Take 1,000 mg by mouth every 6 (six) hours as needed for moderate pain.   Yes [provider]  Cyanocobalamin (VITAMIN B-12) 2000 MCG TBCR Take 2,000 mcg by mouth daily.   Yes [provider]  levothyroxine (SYNTHROID, LEVOTHROID) 50 MCG tablet Take 50 mcg by mouth at bedtime.    Yes [provider]  lisinopril (PRINIVIL,ZESTRIL) 20 MG tablet Take 20 mg by mouth at bedtime.    Yes [provider]  magnesium oxide (MAG-OX) 400 (241.3 Mg) MG tablet Take 400 mg by mouth at bedtime.  01/04/19  Yes [provider]  Omega-3 1000 MG CAPS Take 1 g by mouth at bedtime.    Yes [provider]  VITAMIN D PO Take 1 tablet by mouth at bedtime.   Yes [provider]  ciprofloxacin (CIPRO) 500 MG tablet Take 1 tablet (500 mg total) by mouth every 12 (twelve) hours for 7 days. 04/15/19 04/22/19  Madilyn Hook A, PA-C  metroNIDAZOLE (FLAGYL) 500 MG tablet Take 1 tablet (500 mg total) by mouth 3 (three) times daily for 7 days. 04/15/19 04/22/19  Alveria Apley, PA-C  omeprazole (PRILOSEC) 20 MG capsule Take 1 capsule (20 mg total) by mouth daily. 04/15/19 05/15/19  Alveria Apley, PA-C  Family History Family History  Problem Relation Age of Onset   Throat cancer Mother    Lung cancer Mother    Colitis Mother        resection   Hepatitis Brother    Heart disease Maternal Grandmother    Heart disease Maternal Grandfather    Uterine cancer Sister    Liver cancer Cousin    Leukemia Cousin    Lung cancer Cousin     Social History Social History   Tobacco Use   Smoking status: Never Smoker   Smokeless tobacco: Never Used  Substance Use Topics   Alcohol use: Not Currently   Drug use: Never     Allergies   Codeine and Nsaids   Review of Systems Review of Systems  Constitutional: Negative for appetite change, chills and fever.  HENT: Negative for congestion.   Respiratory:  Positive for chest tightness. Negative for cough and shortness of breath.   Cardiovascular: Positive for chest pain. Negative for palpitations and leg swelling.  Gastrointestinal: Positive for abdominal pain. Negative for abdominal distention, constipation, diarrhea, nausea and vomiting.  Genitourinary: Negative for dysuria.  Musculoskeletal: Positive for back pain.  Skin: Negative for rash.  Allergic/Immunologic: Negative for immunocompromised state.  Neurological: Negative for dizziness and light-headedness.     Physical Exam Updated Vital Signs BP (!) 167/82 (BP Location: Left Arm)    Pulse 83    Temp 98.8 F (37.1 C) (Oral)    Resp 18    SpO2 95%   Physical Exam Vitals signs and nursing note reviewed.  Constitutional:      General: She is not in acute distress.    Appearance: Normal appearance. She is obese. She is not ill-appearing, toxic-appearing or diaphoretic.  HENT:     Head: Normocephalic and atraumatic.     Mouth/Throat:     Mouth: Mucous membranes are moist.  Eyes:     Conjunctiva/sclera: Conjunctivae normal.  Cardiovascular:     Rate and Rhythm: Normal rate and regular rhythm.     Heart sounds: Normal heart sounds.  Pulmonary:     Effort: Pulmonary effort is normal.     Breath sounds: Normal breath sounds.  Chest:     Chest wall: No mass or tenderness.  Abdominal:     General: Bowel sounds are normal.     Palpations: Abdomen is soft.     Tenderness: There is abdominal tenderness in the epigastric area and left lower quadrant. There is no guarding or rebound.  Musculoskeletal:     Right lower leg: No edema.     Left lower leg: No edema.  Skin:    General: Skin is dry.  Neurological:     Mental Status: She is alert.  Psychiatric:        Mood and Affect: Mood normal.      ED Treatments / Results  Labs (all labs ordered are listed, but only abnormal results are displayed) Labs Reviewed  BASIC METABOLIC PANEL - Abnormal; Notable for the following  components:      Result Value   Glucose, Bld 125 (*)    All other components within normal limits  HEPATIC FUNCTION PANEL - Abnormal; Notable for the following components:   Total Bilirubin 2.8 (*)    Bilirubin, Direct 0.3 (*)    Indirect Bilirubin 2.5 (*)    All other components within normal limits  URINE CULTURE  CBC  URINALYSIS, ROUTINE W REFLEX MICROSCOPIC  TROPONIN I (HIGH SENSITIVITY)  TROPONIN I (HIGH  SENSITIVITY)    EKG EKG Interpretation  Date/Time:  Sunday April 15 2019 12:17:27 EST Ventricular Rate:  80 PR Interval:  148 QRS Duration: 88 QT Interval:  378 QTC Calculation: 435 R Axis:   80 Text Interpretation: Normal sinus rhythm Nonspecific T wave abnormality Abnormal ECG No old tracing to compare Confirmed by Malvin Johns [phone]) on 04/15/2019 1:59:04 PM   Radiology Dg Chest 2 View  Result Date: 04/15/2019 CLINICAL DATA:  Central chest pain and mild shortness of breath for the past 1.5 hours. EXAM: CHEST - 2 VIEW COMPARISON:  12/13/2016 FINDINGS: Normal sized heart. Tortuous and mildly calcified thoracic aorta. Stable mild diffuse prominence of the interstitial markings with normal vascularity. No pleural fluid. Thoracic and cervical spine degenerative changes. IMPRESSION: No acute abnormality. Stable mild chronic interstitial lung disease. Electronically Signed   By: Claudie Revering M.D.   On: 04/15/2019 13:17   Ct Abdomen Pelvis W Contrast  Result Date: 04/15/2019 CLINICAL DATA:  Abdominal pain, suspected diverticulitis. EXAM: CT ABDOMEN AND PELVIS WITH CONTRAST TECHNIQUE: Multidetector CT imaging of the abdomen and pelvis was performed using the standard protocol following bolus administration of intravenous contrast. CONTRAST:  116m OMNIPAQUE IOHEXOL 300 MG/ML  SOLN COMPARISON:  10/02/2018 and prior studies dating back to 2014 FINDINGS: Lower chest: No signs of pleural effusion or consolidation. Hepatobiliary: Lobular hepatic contours. No signs of focal  hepatic lesion. Fissural widening is noted. Pancreas: Stable 4.2 x 2.5 cm lesion in the pancreatic tail/splenic hilum not changed since 2014. The remainder of pancreas is normal. Spleen: Splenomegaly as before. Adrenals/Urinary Tract: Normal adrenal glands. Kidneys enhance symmetrically without focal lesion. Stomach/Bowel: No signs of small bowel abnormality. Signs of acute diverticulitis affecting a segment of the sigmoid colon. No signs of pericolonic abscess. Pericolonic stranding extends to the left pelvic sidewall. No signs of free air in the abdomen. Vascular/Lymphatic: Patent abdominal vasculature. Moderate calcific atherosclerotic changes throughout the abdominal aorta. No signs of aneurysm. No signs of adenopathy. Reproductive: Post hysterectomy. Other: No signs of abscess, ascites or free air. Musculoskeletal: No signs of acute bone process. Spinal and hip degenerative changes. IMPRESSION: 1. Signs of acute diverticulitis affecting a segment of the sigmoid colon. No pericolonic abscess. Segmental colonic thickening likely related to diverticular disease. Consider follow-up colonoscopy or correlation with recent colonoscopy results if available. 2. Stable 4.2 x 2.5 cm lesion in the pancreatic tail/splenic hilum. This may represent a benign finding or indolent neoplasm. A neuroendocrine tumor could have this appearance. Consider biochemical correlation with further imaging with MRI as warranted in this patient with cirrhosis and signs of portal hypertension. 3. Atherosclerosis. Aortic Atherosclerosis (ICD10-I70.0). Electronically Signed   By: GZetta BillsM.D.   On: 04/15/2019 15:58    Procedures Procedures (including critical care time)  Medications Ordered in ED Medications  ketorolac (TORADOL) 15 MG/ML injection 15 mg (15 mg Intravenous Not Given 04/15/19 1458)  sodium chloride flush (NS) 0.9 % injection 3 mL (3 mLs Intravenous Given 04/15/19 1512)  iohexol (OMNIPAQUE) 300 MG/ML solution 100  mL (100 mLs Intravenous Contrast Given 04/15/19 1450)  morphine 4 MG/ML injection 4 mg (4 mg Intravenous Given 04/15/19 1512)  ciprofloxacin (CIPRO) tablet 500 mg (500 mg Oral Given 04/15/19 1648)  metroNIDAZOLE (FLAGYL) tablet 500 mg (500 mg Oral Given 04/15/19 1647)     Initial Impression / Assessment and Plan / ED Course  I have reviewed the triage vital signs and the nursing notes.  Pertinent labs & imaging results that were available during my  care of the patient were reviewed by me and considered in my medical decision making (see chart for details).  Clinical Course as of Apr 14 1648  Nancy Fetter Apr 15, 2019  1430 Patient presenting with abdominal pain consistent with previous flareups of diverticulitis in the past.  Also reporting she had a headache and intermittent chest tightness and shortness 2 hours prior to arrival.  She has not had any chest pain while she has been here in the ED and was reevaluated multiple times.   [KM]  1611 Patient improved with morphine and has not had recurrent chest pain. Labs reassuring with normal white count. CT showing diverticulitis without abscess or perforation. She has a pancreatic lesion which is similar to previous pancreatic lesion that has been present as far back as 2014 and is of similar size. Chest xray normal and no acute findings on EKG. Initial trop 3 and second trop was 5.  I think symptoms are related to her GI issues and diverticulitis.  Patient will be given Cipro and Flagyl and the PPI and she has follow-up this week with her primary care doctor.  She was advised on clear liquid diet and advance as tolerated and advised on strict return precautions.  Patient discussed with Dr. Tamera Punt and plan agreed upon.   [KM]    Clinical Course User Index [KM] Alveria Apley, PA-C       Based on review of vitals, medical screening exam, lab work and/or imaging, there does not appear to be an acute, emergent etiology for the patient's symptoms.  Counseled pt on good return precautions and encouraged both PCP and ED follow-up as needed.  Prior to discharge, I also discussed incidental imaging findings with patient in detail and advised appropriate, recommended follow-up in detail.  Clinical Impression: 1. Diverticulitis   2. Atypical chest pain     Disposition: Discharge  Prior to providing a prescription for a controlled substance, I independently reviewed the patient's recent prescription history on the [place]. The patient had no recent or regular prescriptions and was deemed appropriate for a brief, less than 3 day prescription of narcotic for acute analgesia.  This note was prepared with assistance of Systems analyst. Occasional wrong-word or sound-a-like substitutions may have occurred due to the inherent limitations of voice recognition software.   Final Clinical Impressions(s) / ED Diagnoses   Final diagnoses:  Diverticulitis  Atypical chest pain    ED Discharge Orders         Ordered    omeprazole (PRILOSEC) 20 MG capsule  Daily     04/15/19 1649    ciprofloxacin (CIPRO) 500 MG tablet  Every 12 hours     04/15/19 1649    metroNIDAZOLE (FLAGYL) 500 MG tablet  3 times daily     04/15/19 1649           Kristine Royal 04/15/19 1649    Malvin Johns, MD 04/15/19 [phone]

## 2019-04-15 NOTE — ED Triage Notes (Signed)
C/o intermittent burning to center of chest and mild SOB x 1 1/2 hrs.  Denies nausea and vomiting.  Reports diverticulitis flare-up over the last 3 days with constipation.

## 2019-04-15 NOTE — Discharge Instructions (Signed)
Based on review of vitals, medical screening exam, lab work and/or imaging, there does not appear to be an acute, emergent etiology for the patient's symptoms. Counseled pt on good return precautions and encouraged both PCP and ED follow-up as needed.  Prior to discharge, I also discussed incidental imaging findings with patient in detail and advised appropriate, recommended follow-up in detail.  Clinical Impression: 1. Diverticulitis   2. Atypical chest pain     Disposition: Discharge  Prior to providing a prescription for a controlled substance, I independently reviewed the patient's recent prescription history on the [place]. The patient had no recent or regular prescriptions and was deemed appropriate for a brief, less than 3 day prescription of narcotic for acute analgesia.  This note was prepared with assistance of Systems analyst. Occasional wrong-word or sound-a-like substitutions may have occurred due to the inherent limitations of voice recognition software.

## 2019-04-15 NOTE — ED Notes (Signed)
Patient verbalizes understanding of discharge instructions. Opportunity for questioning and answers were provided. Armband removed by staff, pt discharged from ED.  

## 2019-04-17 LAB — URINE CULTURE: Culture: 20000 — AB

## 2019-04-18 ENCOUNTER — Telehealth: Payer: Self-pay | Admitting: Emergency Medicine

## 2019-04-18 NOTE — Telephone Encounter (Signed)
Post ED Visit - Positive Culture Follow-up  Culture report reviewed by antimicrobial stewardship pharmacist: [place] Team []  Elenor Quinones, Pharm.D. [x]  Heide Guile, Pharm.D., BCPS AQ-ID []  Parks Neptune, Pharm.D., BCPS []  Alycia Rossetti, Pharm.D., BCPS []  [place], Pharm.D., BCPS, AAHIVP []  Legrand Como, Pharm.D., BCPS, AAHIVP []  Salome Arnt, PharmD, BCPS []  Johnnette Gourd, PharmD, BCPS []  Hughes Better, PharmD, BCPS []  Leeroy Cha, PharmD []  Laqueta Linden, PharmD, BCPS []  Albertina Parr, PharmD  [place] Team []  Leodis Sias, PharmD []  Lindell Spar, PharmD []  Royetta Asal, PharmD []  Graylin Shiver, Rph []  Rema Fendt) Glennon Mac, PharmD []  Arlyn Dunning, PharmD []  Netta Cedars, PharmD []  Dia Sitter, PharmD []  Leone Haven, PharmD []  Gretta Arab, PharmD []  Theodis Shove, PharmD []  Peggyann Juba, PharmD []  Reuel Boom, PharmD   Positive urine culture Treated with ciprofloxacin and metronidazole, organism sensitive to the same and no further patient follow-up is required at this time.  Hazle Nordmann 04/18/2019, 1:36 PM

## 2019-06-04 ENCOUNTER — Telehealth: Payer: Self-pay

## 2019-06-04 NOTE — Telephone Encounter (Signed)
-----   Message from Marzella Schlein, [place] sent at 06/27/2018  9:57 AM EST ----- Regarding: FW: twinrix  ----- Message ----- From: Angie Fava, LPN Sent: [id number]/7670   9:16 AM EST To: Marzella Schlein, CMA Subject: twinrix                                        Patient needs a booster Twinrix in one year 06/07/2019

## 2019-06-04 NOTE — Telephone Encounter (Signed)
Patient states she is due for follow up visit anyway and would like to schedule them both together. Appt scheduled on 07/02/18 at 10:30am to see Dr. Fuller Plan and get last booster Twinrix injection.

## 2019-07-03 ENCOUNTER — Other Ambulatory Visit (INDEPENDENT_AMBULATORY_CARE_PROVIDER_SITE_OTHER): Payer: Medicare Other

## 2019-07-03 ENCOUNTER — Ambulatory Visit (INDEPENDENT_AMBULATORY_CARE_PROVIDER_SITE_OTHER): Payer: Medicare Other | Admitting: Gastroenterology

## 2019-07-03 ENCOUNTER — Encounter: Payer: Self-pay | Admitting: Gastroenterology

## 2019-07-03 VITALS — BP 128/74 | HR 66 | Temp 97.9°F | Ht 65.0 in | Wt 268.0 lb

## 2019-07-03 DIAGNOSIS — K5732 Diverticulitis of large intestine without perforation or abscess without bleeding: Secondary | ICD-10-CM | POA: Diagnosis not present

## 2019-07-03 DIAGNOSIS — K7460 Unspecified cirrhosis of liver: Secondary | ICD-10-CM | POA: Diagnosis not present

## 2019-07-03 DIAGNOSIS — R935 Abnormal findings on diagnostic imaging of other abdominal regions, including retroperitoneum: Secondary | ICD-10-CM | POA: Diagnosis not present

## 2019-07-03 LAB — CBC WITH DIFFERENTIAL/PLATELET
Basophils Absolute: 0 K/uL (ref 0.0–0.1)
Basophils Relative: 0.8 % (ref 0.0–3.0)
Eosinophils Absolute: 0.1 K/uL (ref 0.0–0.7)
Eosinophils Relative: 1.1 % (ref 0.0–5.0)
HCT: 41.2 % (ref 36.0–46.0)
Hemoglobin: 13.6 g/dL (ref 12.0–15.0)
Lymphocytes Relative: 24 % (ref 12.0–46.0)
Lymphs Abs: 1.5 K/uL (ref 0.7–4.0)
MCHC: 33.1 g/dL (ref 30.0–36.0)
MCV: 93.3 fl (ref 78.0–100.0)
Monocytes Absolute: 0.6 K/uL (ref 0.1–1.0)
Monocytes Relative: 10.1 % (ref 3.0–12.0)
Neutro Abs: 3.9 K/uL (ref 1.4–7.7)
Neutrophils Relative %: 64 % (ref 43.0–77.0)
Platelets: 162 K/uL (ref 150.0–400.0)
RBC: 4.42 Mil/uL (ref 3.87–5.11)
RDW: 13.7 % (ref 11.5–15.5)
WBC: 6.1 K/uL (ref 4.0–10.5)

## 2019-07-03 LAB — COMPREHENSIVE METABOLIC PANEL
ALT: 37 U/L — ABNORMAL HIGH (ref 0–35)
AST: 49 U/L — ABNORMAL HIGH (ref 0–37)
Albumin: 4.1 g/dL (ref 3.5–5.2)
Alkaline Phosphatase: 87 U/L (ref 39–117)
BUN: 13 mg/dL (ref 6–23)
CO2: 31 mEq/L (ref 19–32)
Calcium: 9.6 mg/dL (ref 8.4–10.5)
Chloride: 101 mEq/L (ref 96–112)
Creatinine, Ser: 0.66 mg/dL (ref 0.40–1.20)
GFR: 89.34 mL/min (ref >60.00)
Glucose, Bld: 90 mg/dL (ref 70–99)
Potassium: 4.3 mEq/L (ref 3.5–5.1)
Sodium: 138 mEq/L (ref 135–145)
Total Bilirubin: 1.1 mg/dL (ref 0.2–1.2)
Total Protein: 7.7 g/dL (ref 6.0–8.3)

## 2019-07-03 LAB — PROTIME-INR
INR: 1.2 ratio — ABNORMAL HIGH (ref 0.8–1.0)
Prothrombin Time: 13.2 s — ABNORMAL HIGH (ref 9.6–13.1)

## 2019-07-03 NOTE — Patient Instructions (Addendum)
Your provider has requested that you go to the basement level for lab work before leaving today. Press "B" on the elevator. The lab is located at the first door on the left as you exit the elevator.  We will contact you to schedule your MRI for April in March.   Due to recent changes in healthcare laws, you may see the results of your imaging and laboratory studies on MyChart before your provider has had a chance to review them.  We understand that in some cases there may be results that are confusing or concerning to you. Not all laboratory results come back in the same time frame and the provider may be waiting for multiple results in order to interpret others.  Please give [place] 48 hours in order for your provider to thoroughly review all the results before contacting the office for clarification of your results.   Thank you for choosing me and [facility].  Pricilla Riffle. Dagoberto Ligas., MD., [facility] Regal

## 2019-07-03 NOTE — Progress Notes (Signed)
    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  She is accompanied by her sister.  She relates she had diverticulitis in November and again in January.  She states she just completed a course of Cipro and Flagyl and her abdominal pain has completely resolved.  No other gastrointestinal complaints.  She relates that she probably will require knee replacement.  She had difficulty obtaining insurance coverage for Twinrix and expressed concerns.  CT AP 04/15/2019 IMPRESSION: 1. Signs of acute diverticulitis affecting a segment of the sigmoid colon. No pericolonic abscess. Segmental colonic thickening likely related to diverticular disease. Consider follow-up colonoscopy or correlation with recent colonoscopy results if available. 2. Stable 4.2 x 2.5 cm lesion in the pancreatic tail/splenic hilum. This may represent a benign finding or indolent neoplasm. A neuroendocrine tumor could have this appearance. Consider biochemical correlation with further imaging with MRI as warranted in this patient with cirrhosis and signs of portal hypertension. 3. Atherosclerosis.  Current Medications, Allergies, Past Medical History, Past Surgical History, Family History and Social History were reviewed in [employer] record.   Physical Exam: General: Well developed, well nourished, obese, no acute distress Head: Normocephalic and atraumatic Eyes:  sclerae anicteric, EOMI Ears: Normal auditory acuity Mouth: No deformity or lesions Lungs: Clear throughout to auscultation Heart: Regular rate and rhythm; no murmurs, rubs or bruits Abdomen: Soft, non tender and non distended. No masses, hepatosplenomegaly or hernias noted. Normal Bowel sounds Rectal: Not done Musculoskeletal: Symmetrical with no gross deformities  Pulses:  Normal pulses noted Extremities: No clubbing, cyanosis, edema or deformities noted Neurological: Alert oriented x 4, grossly  nonfocal Psychological:  Alert and cooperative. Normal mood and affect   Assessment and Recommendations:  1.  Cirrhosis, moderately active steatohepatitis, without decompensation. No varices or gastropathy on EGD in Sept 2019.  Continue to avoid alcohol use.  No more than 2 g of Tylenol daily.  Complete Hep A & B vaccine after trying to optimize, understand the her insurance coverage.  She is concerned about anticoagulation following knee replacement and her risk of GI bleeding.  Reassured her that her EGD in 2019 did not show varices or portal gastropathy.  It is reasonable to repeat her EGD this year prior to knee replacement to reassess for varices, gastropathy.  Abdominal MRI in April for [facility] screening and for further evaluation of problem #2. CBC, CMP, PT/INR today. REV in 6 months.   2.  Pancreatic tail/splenic hilum lesion.  This lesion has been present for several years and is essentially stable.  Abdominal MRI to further evaluate as outlined above.  3.  Recurrent diverticulitis.  Symptoms resolved after completing a course of Cipro and Flagyl.  Long-term high-fiber diet with adequate daily fluid intake.  Minimize nuts seeds and popcorn.  4.  Personal history of adenomatous colon polyps.  A 3-year interval surveillance colonoscopy is recommended in September 2022.

## 2019-07-09 ENCOUNTER — Telehealth: Payer: Self-pay | Admitting: Gastroenterology

## 2019-07-09 NOTE — Telephone Encounter (Signed)
Patient is returning your call.  

## 2019-07-09 NOTE — Telephone Encounter (Signed)
Patient is scheduled for her next Twinrix on 2/11

## 2019-07-12 ENCOUNTER — Other Ambulatory Visit: Payer: Self-pay

## 2019-07-12 ENCOUNTER — Ambulatory Visit (INDEPENDENT_AMBULATORY_CARE_PROVIDER_SITE_OTHER): Payer: Medicare Other | Admitting: Gastroenterology

## 2019-07-12 DIAGNOSIS — Z23 Encounter for immunization: Secondary | ICD-10-CM | POA: Diagnosis not present

## 2019-07-12 DIAGNOSIS — K7460 Unspecified cirrhosis of liver: Secondary | ICD-10-CM

## 2019-07-12 NOTE — Patient Instructions (Signed)
Pt tolerated injection well

## 2019-08-22 ENCOUNTER — Telehealth: Payer: Self-pay

## 2019-08-22 ENCOUNTER — Other Ambulatory Visit: Payer: Self-pay

## 2019-08-22 DIAGNOSIS — R9389 Abnormal findings on diagnostic imaging of other specified body structures: Secondary | ICD-10-CM

## 2019-08-22 DIAGNOSIS — K7460 Unspecified cirrhosis of liver: Secondary | ICD-10-CM

## 2019-08-22 NOTE — Telephone Encounter (Signed)
Informed patient we will put the order in for a MRI of the abdomen at [place] and they will contact her to schedule. Called [facility] to confirm they had my order. They will contact her today to schedule.

## 2019-08-22 NOTE — Telephone Encounter (Signed)
-----   Message from Lindon Romp, [place] sent at 08/13/2019  4:37 PM EDT -----  ----- Message ----- From: Lindon Romp, CMA Sent: 08/08/2019 To: Marzella Schlein, CMA  Need MRI of abdomen in April for abnormal CT scan of pancreas from November. Also MRI is for [facility] screening for cirrhosis. Call patient and schedule MRI at [place]. Patient does not have preference on day.

## 2019-09-21 ENCOUNTER — Other Ambulatory Visit: Payer: Self-pay

## 2019-09-21 ENCOUNTER — Ambulatory Visit
Admission: RE | Admit: 2019-09-21 | Discharge: 2019-09-21 | Disposition: A | Discharge status: Home / Self Care | Payer: Medicare Other | Source: Ambulatory Visit | Attending: Gastroenterology | Admitting: Gastroenterology

## 2019-09-21 DIAGNOSIS — R9389 Abnormal findings on diagnostic imaging of other specified body structures: Secondary | ICD-10-CM

## 2019-09-21 DIAGNOSIS — K7460 Unspecified cirrhosis of liver: Secondary | ICD-10-CM

## 2019-09-21 IMAGING — MR MR ABDOMEN WO/W CM
11 of 18 series · 24 of 48 positions shown · IV contrast (Multihance)
Comparison: CT on [DATE] and earlier CTs dating back to [83]

CLINICAL DATA: Follow-up pancreatic mass. Cirrhosis.

EXAM:
MRI ABDOMEN WITHOUT AND WITH CONTRAST
TECHNIQUE: Multiplanar multisequence MR imaging of the abdomen was performed
both before and after the administration of intravenous contrast.
CONTRAST:  20mL MULTIHANCE GADOBENATE DIMEGLUMINE 529 MG/ML IV SOLN

[Series 3: T2 · coronal · 5.0mm · 1.48mm/px · 1 of 28 slices shown (1 of 3)]
[im 1/28]
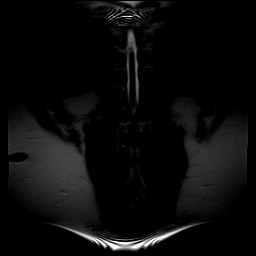

[Series 4: T2 · axial · 5.0mm · 1.52mm/px · 1 of 40 slices shown (2 of 3)]
[im 1/40]
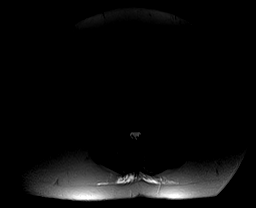

[Series 5: axial in out · axial · 5.5mm · 0.76mm/px · 1 of 76 slices shown]
[im 1/76]
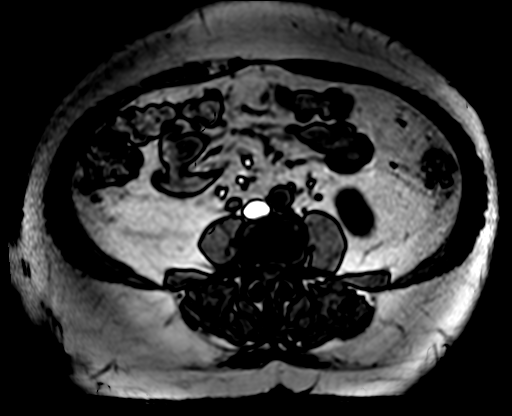

[Series 6: MRCP fat-sat · coronal · 3.0mm · 0.70mm/px · 1 of 22 slices shown]
[im 1/22]
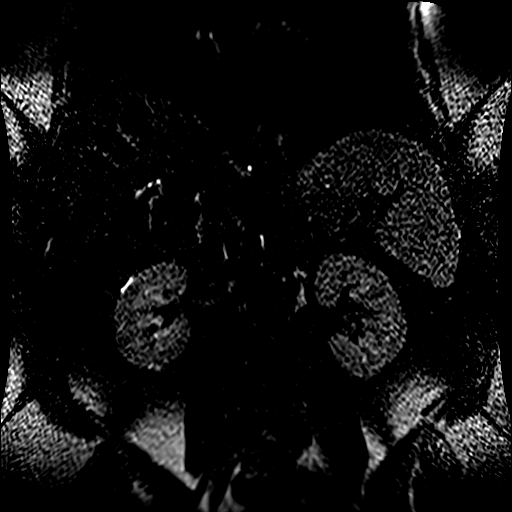

[Series 7: T2 · axial · 5.0mm · 0.76mm/px · 1 of 39 slices shown (3 of 3)]
[im 1/39]
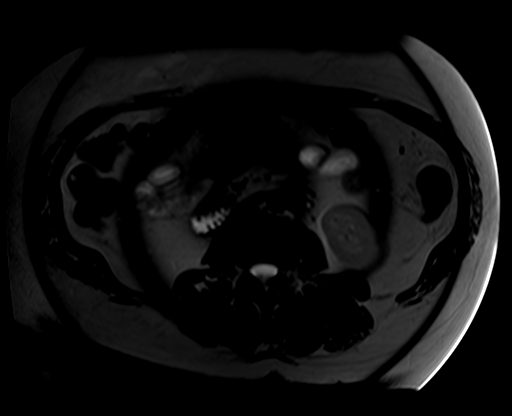

[Series 8: ep2d_diff_b50_500_800_p2_trig · axial · 5.0mm · 2.03mm/px · z∈[-44,+194]mm · 3 of 117 slices shown]
[im 1/117]
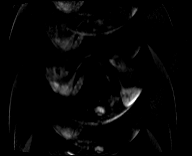
[im 59/117]
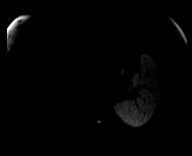
[im 117/117]
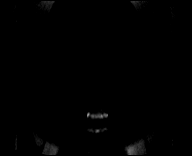

[Series 9: ep2d_diff_b50_500_800_p2_trig_adc · axial · 5.0mm · 2.03mm/px · 1 of 39 slices shown]
[im 1/39]
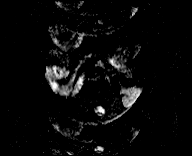

[Series 12: T1 dynamic · axial · non-contrast · 2.0mm · 0.78mm/px · z∈[-71,+151]mm · 3 of 112 slices shown]
[im 1/112]
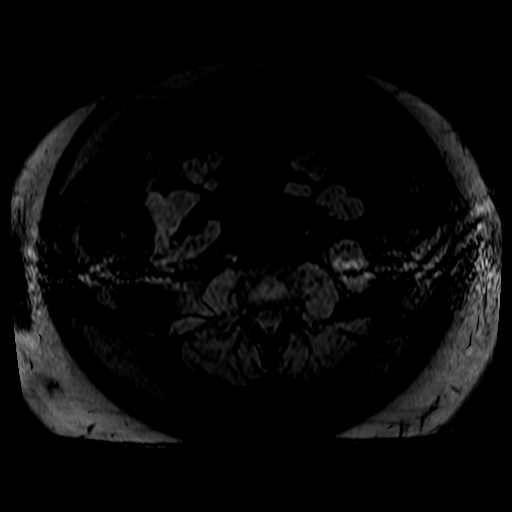
[im 56/112]
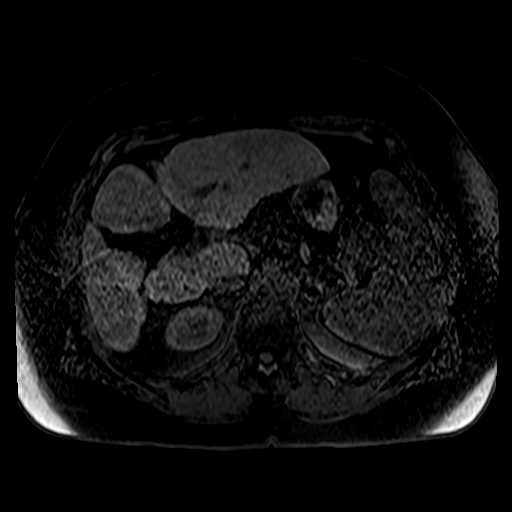
[im 112/112]
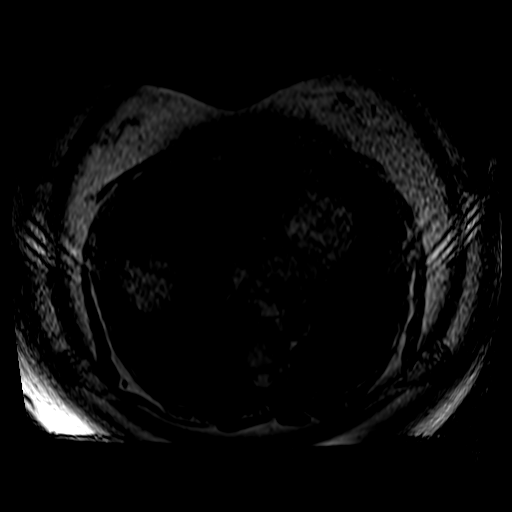

[Series 13: post 25 sec · axial · 2.0mm · 0.78mm/px · z∈[-71,+151]mm · 4 of 112 slices shown]
[im 1/112]
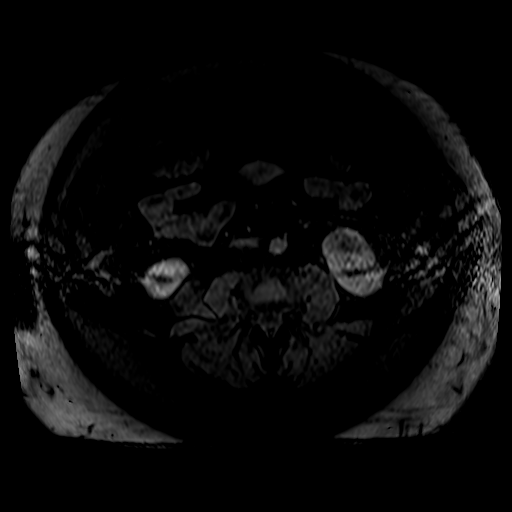
[im 38/112]
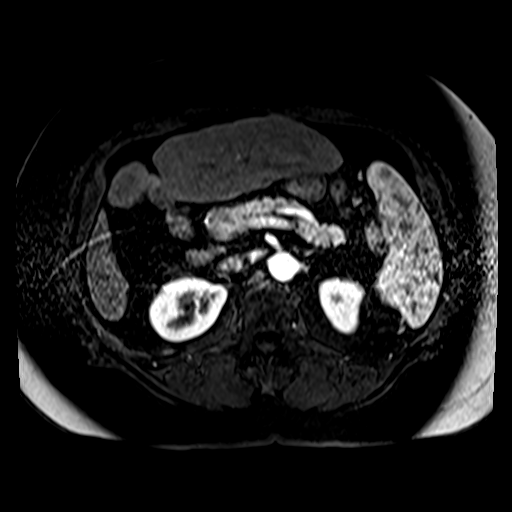
[im 75/112]
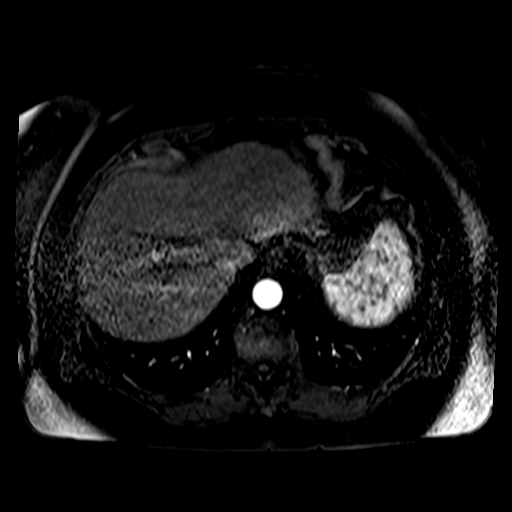
[im 112/112]
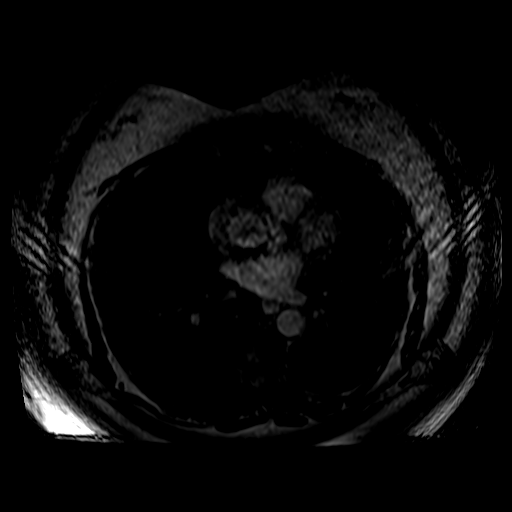

[Series 14: post 25 sec_sub · axial · 2.0mm · 0.78mm/px · z∈[-71,+151]mm · 4 of 112 slices shown]
[im 1/112]
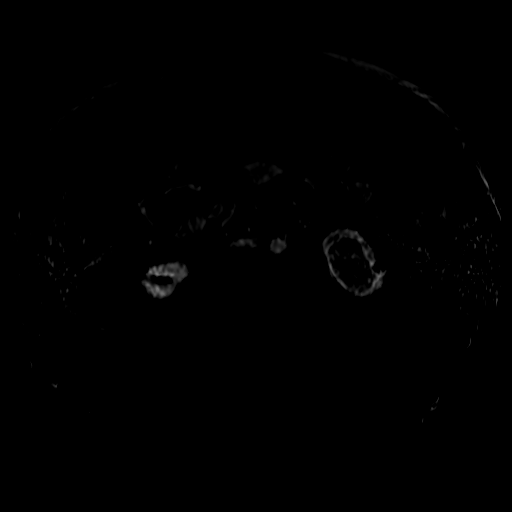
[im 38/112]
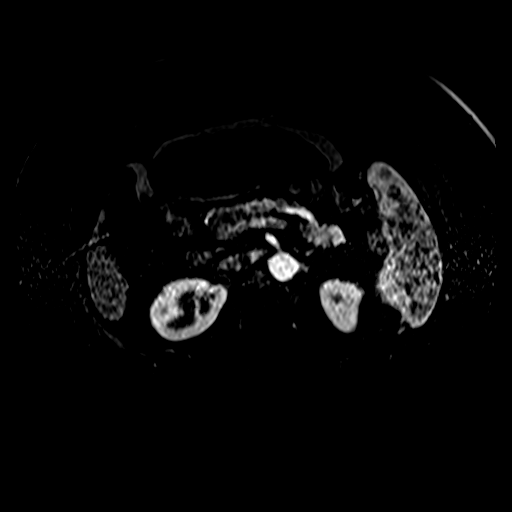
[im 75/112]
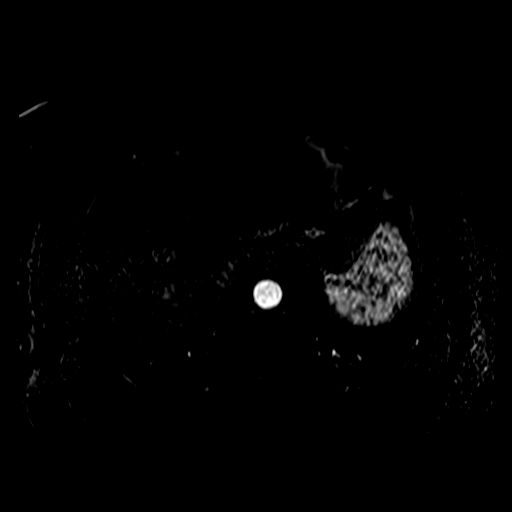
[im 112/112]
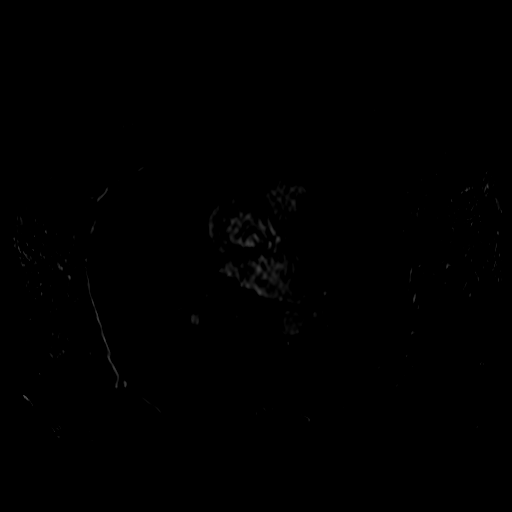

[Series 15: post 45 sec · axial · 2.0mm · 0.78mm/px · z∈[-71,+151]mm · 4 of 112 slices shown]
[im 1/112]
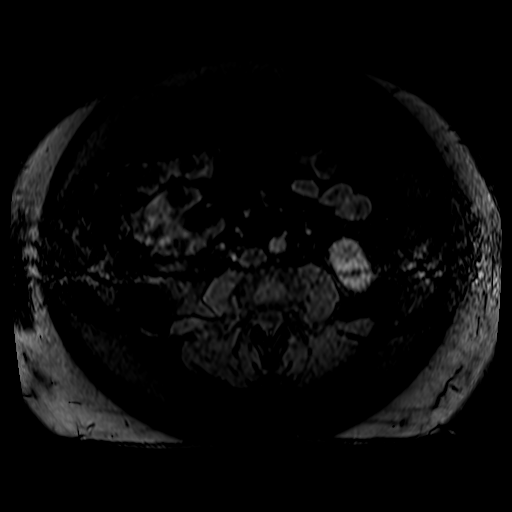
[im 38/112]
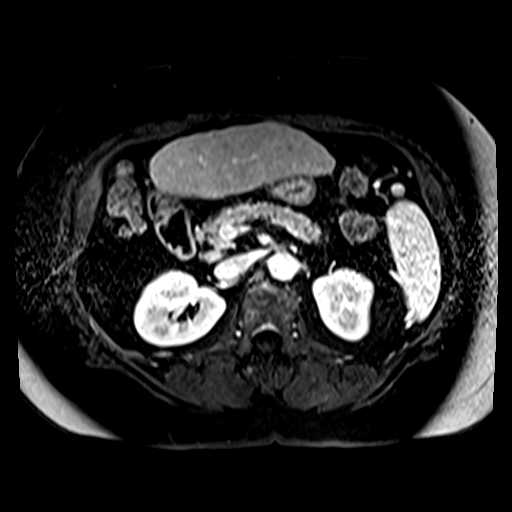
[im 75/112]
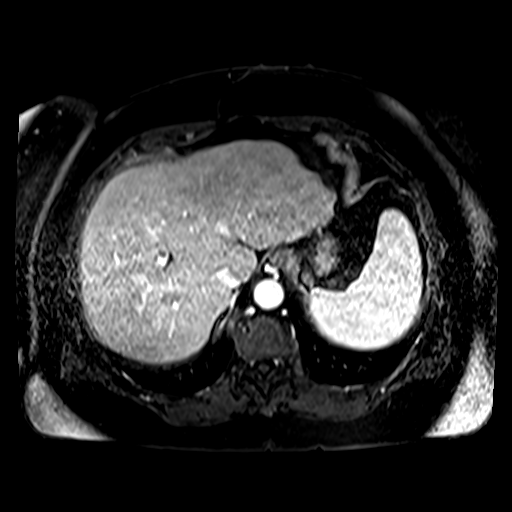
[im 112/112]
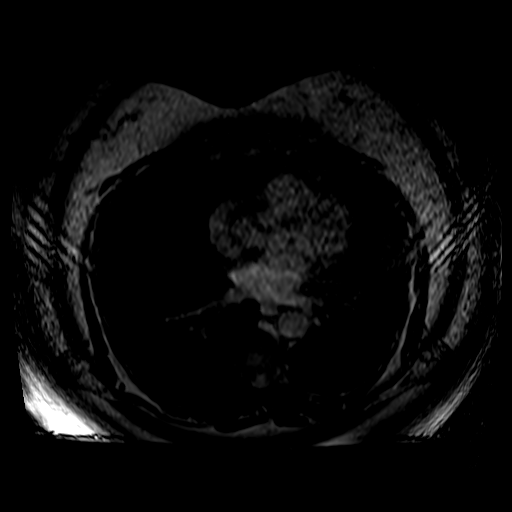

[24 of 48 positions shown; findings below may reference images not displayed]

FINDINGS: Lower chest: No acute findings.

Hepatobiliary: Hepatic cirrhosis again demonstrated. No hepatic
masses identified. Portal and hepatic veins remain patent. Prior
cholecystectomy. No evidence of biliary obstruction.

Pancreas: A soft tissue mass showing contrast enhancement and
internal cystic areas superiorly is again seen in the splenic hilum,
which involves or abuts the pancreatic tail. This measures 4.1 x
cm on image 60/17, without significant change compared to previous
CTs dating back to [83]. This is consistent with a benign etiology,
and differential diagnosis includes pancreatic neoplasm such as a
solid pseudopapillary tumor or partially degenerated/infarcted
accessory splenule.

Spleen:  Within normal limits in size and appearance.

Adrenals/Urinary Tract: No masses identified. No evidence of
hydronephrosis.

Stomach/Bowel: Visualized portion unremarkable.

Vascular/Lymphatic: No pathologically enlarged lymph nodes
identified. No abdominal aortic aneurysm.

Other:  None.

Musculoskeletal:  No suspicious bone lesions identified.
IMPRESSION: 1. Stable 4 cm soft tissue mass in the splenic hilum, which involves
or abuts the pancreatic tail. Differential diagnosis includes
pancreatic neoplasm such as a solid pseudopapillary tumor, or
partially degenerated/infarcted accessory splenule. Consider further
evaluation with nuclear medicine liver-spleen scan with SPECT
imaging.
2. Hepatic cirrhosis. No evidence of hepatic neoplasm.

## 2019-09-21 MED ORDER — GADOBENATE DIMEGLUMINE 529 MG/ML IV SOLN
20.0000 mL | Freq: Once | INTRAVENOUS | Status: AC | PRN
  Administered 2019-09-21: 20 mL via INTRAVENOUS

## 2019-11-13 ENCOUNTER — Telehealth: Payer: Self-pay | Admitting: Gastroenterology

## 2019-11-13 NOTE — Telephone Encounter (Signed)
Patient reports LLQ pain.  She was recently tx at an urgent care for presumed diverticulitis.  She completed her antibiotics yesterday. Pain never really resolved.  She reports her pain is more in her back and radiates to her Left lower side.  Pain has been present intermittently for months.  She will come in and see Alonza Bogus, PA on 11/20/19 1:30

## 2019-11-20 ENCOUNTER — Ambulatory Visit (INDEPENDENT_AMBULATORY_CARE_PROVIDER_SITE_OTHER): Payer: Medicare Other | Admitting: Gastroenterology

## 2019-11-20 ENCOUNTER — Encounter: Payer: Self-pay | Admitting: Gastroenterology

## 2019-11-20 VITALS — BP 134/88 | HR 74 | Ht 65.0 in | Wt 270.0 lb

## 2019-11-20 DIAGNOSIS — R1032 Left lower quadrant pain: Secondary | ICD-10-CM

## 2019-11-20 DIAGNOSIS — K5732 Diverticulitis of large intestine without perforation or abscess without bleeding: Secondary | ICD-10-CM | POA: Diagnosis not present

## 2019-11-20 MED ORDER — AMOXICILLIN-POT CLAVULANATE 875-125 MG PO TABS: 1.0000 | ORAL_TABLET | Freq: Two times a day (BID) | ORAL | 0 refills | Status: DC

## 2019-11-20 NOTE — Progress Notes (Signed)
Reviewed and agree with management plan.  Heman Que T. Madine Sarr, MD FACG [facility]  

## 2019-11-20 NOTE — Patient Instructions (Addendum)
If you are age 67 or older, your body mass index should be between 23-30. Your Body mass index is 44.93 kg/m. If this is out of the aforementioned range listed, please consider follow up with your Primary Care Provider.  If you are age 62 or younger, your body mass index should be between 19-25. Your Body mass index is 44.93 kg/m. If this is out of the aformentioned range listed, please consider follow up with your Primary Care Provider.   You have been scheduled for a CT scan of the abdomen and pelvis at [facility]. You are scheduled on Friday 11/23/19  at 3 pm. You should arrive 15 minutes prior to your appointment time for registration. Please follow the written instructions below on the day of your exam:   Go by radiology at [facility] before your appointment to pick up your prep and instructions.   Start Augmentin 875 mg twice daily for 10 days.

## 2019-11-20 NOTE — Progress Notes (Signed)
11/20/2019 Claudia Weber [id number] 1950/12/18   HISTORY OF PRESENT ILLNESS: This is a pleasant 69 year old female who is a patient of Dr. Lynne Leader.  She follows with him for her compensated NASH cirrhosis and routine colonoscopies.  Her last colonoscopy was as follows in September 2019:  - Incomplete colonoscopy to ascending colon. - One 10 mm polyp in the transverse colon, removed with a cold snare. Resected and retrieved. - Moderate diverticulosis in the sigmoid colon, in the descending colon and in the transverse colon. - The examination was otherwise normal on direct and retroflexion views.  She was last seen by Dr. Fuller Plan on July 03, 2019.  She presents here today with her husband for complaints of left lower quadrant abdominal pain.  She was diagnosed with diverticulitis by CT scan back in November 2020.  This is in the sigmoid colon.  She was treated with Cipro and Flagyl.  Then in January she had another episode and was treated with the same.  When she was seen by Dr. Fuller Plan in February she was feeling better.  Recently she again has developed left lower quadrant abdominal pain.  She also complains of pain into the left side of her back, but says that she has back pain issues as well.  She went to urgent care and they put her on Cipro and Flagyl once again.  She did not complete the Flagyl this time though because she felt like it gave her an upset stomach.  She says that this feels similar to previous episodes.  Past Medical History:  Diagnosis Date  . Cat scratch fever   . High blood pressure   . Other specified disorders of thyroid   . Uterine cancer [facility])    Past Surgical History:  Procedure Laterality Date  . ABDOMINAL HYSTERECTOMY  11/2016  . BLADDER SURGERY     polyp removed  . Breast cyst removal    . CHOLECYSTECTOMY    . KNEE SURGERY Left    arthroscopic  . LIVER BIOPSY    . right knee surgery      reports that she has never smoked. She has never used  smokeless tobacco. She reports previous alcohol use. She reports that she does not use drugs. family history includes Colitis in her mother; Colon cancer in her cousin; Heart disease in her maternal grandfather and maternal grandmother; Hepatitis in her brother; Leukemia in her cousin; Liver cancer in her cousin; Lung cancer in her cousin and mother; Throat cancer in her mother; Uterine cancer in her sister. Allergies  Allergen Reactions  . Codeine Other (See Comments)    Makes pt sick  . Nsaids Other (See Comments)    Liver problems       Outpatient Encounter Medications as of 11/20/2019  Medication Sig  . acetaminophen (TYLENOL) 500 MG tablet Take 1,000 mg by mouth every 6 (six) hours as needed for moderate pain.  . Cyanocobalamin (VITAMIN B-12) 2000 MCG TBCR Take 2,000 mcg by mouth daily.  Marland Kitchen levothyroxine (SYNTHROID, LEVOTHROID) 50 MCG tablet Take 50 mcg by mouth at bedtime.   Marland Kitchen lisinopril (PRINIVIL,ZESTRIL) 20 MG tablet Take 20 mg by mouth at bedtime.   . magnesium oxide (MAG-OX) 400 (241.3 Mg) MG tablet Take 400 mg by mouth at bedtime.   . metroNIDAZOLE (FLAGYL) 500 MG tablet Take 500 mg by mouth 3 (three) times daily.  . Omega-3 1000 MG CAPS Take 1 g by mouth at bedtime.   Marland Kitchen VITAMIN D PO Take  1 tablet by mouth at bedtime.  Marland Kitchen omeprazole (PRILOSEC) 20 MG capsule Take 1 capsule (20 mg total) by mouth daily.   No facility-administered encounter medications on file as of 11/20/2019.     REVIEW OF SYSTEMS  : All other systems reviewed and negative except where noted in the History of Present Illness.   PHYSICAL EXAM: BP 134/88   Pulse 74   Ht 5' 5"  (1.651 m)   Wt 270 lb (122.5 kg)   BMI 44.93 kg/m  General: Well developed white female in no acute distress Head: Normocephalic and atraumatic Eyes:  Sclerae anicteric, conjunctiva pink. Ears: Normal auditory acuity Lungs: Clear throughout to auscultation; no increased WOB. Heart: Regular rate and rhythm; no M/R/G. Abdomen: Soft,  non-distended.  BS present.  Mild LLQ TTP. Musculoskeletal: Symmetrical with no gross deformities  Skin: No lesions on visible extremities Extremities: No edema  Neurological: Alert oriented x 4, grossly non-focal Psychological:  Alert and cooperative. Normal mood and affect  ASSESSMENT AND PLAN: *Left lower quadrant abdominal pain: Had CT scan in November 2020 that confirmed diverticulitis in sigmoid colon and then was treated again in January and now a third time just recently.  Still with LLQ abdominal pain, but did not take all of the Flagyl because she thinks it made her nauseous.  Also complaining of left back pain (? If one causing the other).  I am going to repeat a CT scan of the abdomen and pelvis with contrast.  If it again confirms/shows sigmoid diverticulitis and she continues to have recurrence then may need to consider elective surgical resection/surgical evaluation.  Needs BMP today.  I am going to start Augmentin 875 mg twice daily for 10 days in the interim.  Prescription sent to pharmacy.   CC:  Imagene Riches, NP

## 2019-11-21 ENCOUNTER — Telehealth: Payer: Self-pay | Admitting: Gastroenterology

## 2019-11-21 NOTE — Telephone Encounter (Signed)
Hi Claudia Weber, pls disregard this message. This was taken care of by [facility]. Thank you.

## 2019-11-23 ENCOUNTER — Encounter: Payer: Self-pay | Admitting: Gastroenterology

## 2019-11-27 ENCOUNTER — Telehealth: Payer: Self-pay | Admitting: Gastroenterology

## 2019-11-27 NOTE — Telephone Encounter (Signed)
CT Abd/Pelv from [facility] dated 11-23-19  rec'd and placed on [place] desk for review.

## 2019-11-27 NOTE — Telephone Encounter (Signed)
The pt was advised that no results are available and we will call her as soon as received and reviewed.

## 2019-11-28 NOTE — Telephone Encounter (Signed)
Noted.  Please save this to her chart.

## 2019-11-28 NOTE — Telephone Encounter (Signed)
Please let the patient know that her CT scan did not show diverticulitis.  It did not show any cause of her left lower quadrant abdominal pain.  Has she started the Augmentin?  How is she feeling?  If she is still having pain I wonder if this could be primary back source that is radiating to the front.  CT scan confirms cirrhosis without any complicating issues.  Also has a stable area near the pancreatic tail and spleen that appears to be the same dating back to 2014 indicating that it is very likely benign area.  Thank you,  Jess

## 2019-11-28 NOTE — Telephone Encounter (Signed)
The pt has been advised and states she continues to have left lower abd pain radiating to the back. She did start abx without any difference in symptoms.  She is going to call her PCP and get an appt for evaluation.

## 2020-10-08 ENCOUNTER — Telehealth: Payer: Self-pay | Admitting: Gastroenterology

## 2020-10-08 NOTE — Telephone Encounter (Signed)
Inbound call from patient calling in regards to letter she received about scheduling her MRI.  Please advise.

## 2020-10-09 ENCOUNTER — Other Ambulatory Visit: Payer: Self-pay

## 2020-10-09 DIAGNOSIS — R161 Splenomegaly, not elsewhere classified: Secondary | ICD-10-CM

## 2020-10-09 NOTE — Telephone Encounter (Signed)
Patient notified that she will be contacted directly with an appointment date and time for the MRI by [facility].

## 2020-10-29 ENCOUNTER — Other Ambulatory Visit: Payer: Self-pay

## 2020-10-29 ENCOUNTER — Ambulatory Visit
Admission: RE | Admit: 2020-10-29 | Discharge: 2020-10-29 | Disposition: A | Discharge status: Home / Self Care | Payer: Medicare Other | Source: Ambulatory Visit | Attending: Gastroenterology | Admitting: Gastroenterology

## 2020-10-29 DIAGNOSIS — R161 Splenomegaly, not elsewhere classified: Secondary | ICD-10-CM

## 2020-10-29 IMAGING — MR MR ABDOMEN WO/W CM
11 of 17 series · 25 of 48 positions shown · IV contrast (20 ml multihance)
Comparison: MRI abdomen [DATE].

CLINICAL DATA: 4 cm soft tissue mass in the splenic hilum involving
or abutting the pancreatic tail.

EXAM:
MRI ABDOMEN WITHOUT AND WITH CONTRAST
TECHNIQUE: Multiplanar multisequence MR imaging of the abdomen was performed
both before and after the administration of intravenous contrast.
CONTRAST:  20mL MULTIHANCE GADOBENATE DIMEGLUMINE 529 MG/ML IV SOLN

[Series 3: T2 · coronal · 5.0mm · 1.56mm/px · 1 of 32 slices shown (1 of 3)]
[im 1/32]
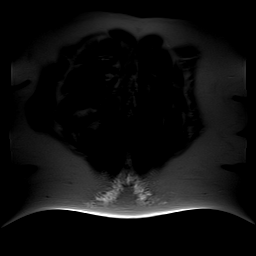

[Series 5: axial tru fisp · axial · 6.0mm · 1.56mm/px · 1 of 37 slices shown]
[im 1/37]
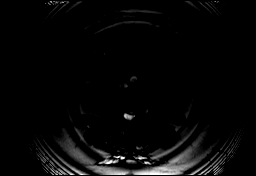

[Series 6: T2 · axial · 6.5mm · 0.74mm/px · 1 of 34 slices shown (2 of 3)]
[im 1/34]
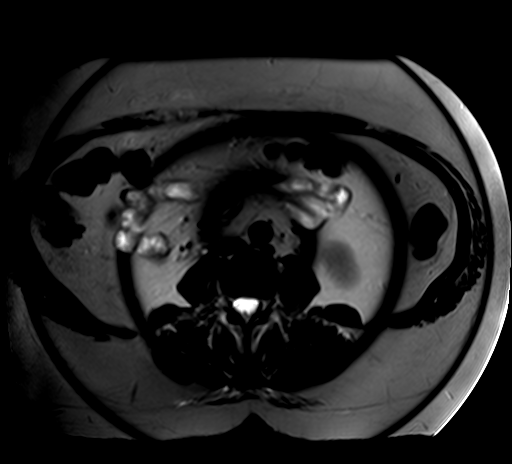

[Series 7: ep2d_diff_b50_500_800_p2 · axial · 6.0mm · 1.98mm/px · z∈[-106,+120]mm · 3 of 90 slices shown]
[im 1/90]
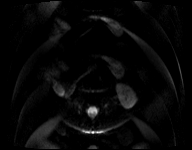
[im 45/90]
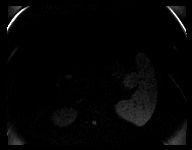
[im 90/90]
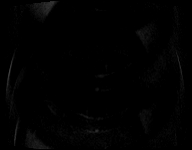

[Series 8: ep2d_diff_b50_500_800_p2_adc · axial · 6.0mm · 1.98mm/px · 1 of 30 slices shown]
[im 1/30]
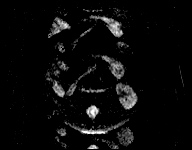

[Series 9: T2 · axial · 5.0mm · 1.41mm/px · 1 of 38 slices shown (3 of 3)]
[im 1/38]
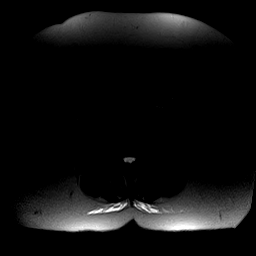

[Series 10: axial in out · axial · 6.0mm · 0.74mm/px · z∈[-122,+119]mm · 2 of 72 slices shown]
[im 1/72]
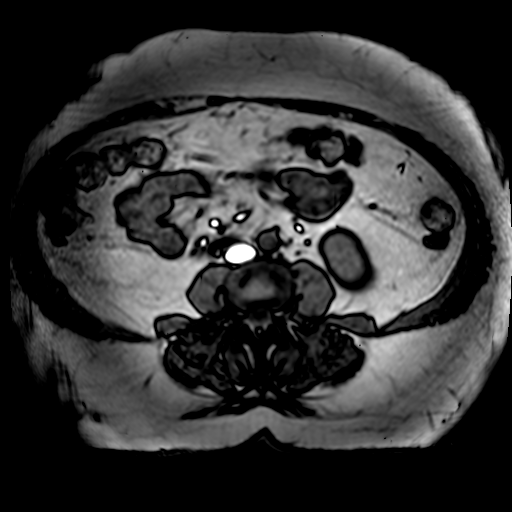
[im 72/72]
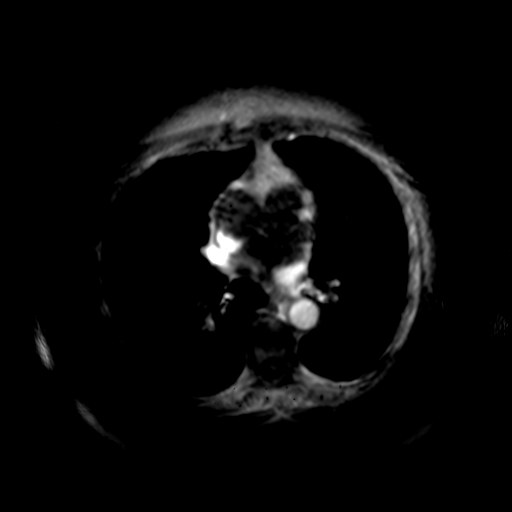

[Series 11: T1 dynamic · axial · non-contrast · 2.4mm · 0.78mm/px · z∈[-95,+113]mm · 3 of 88 slices shown]
[im 1/88]
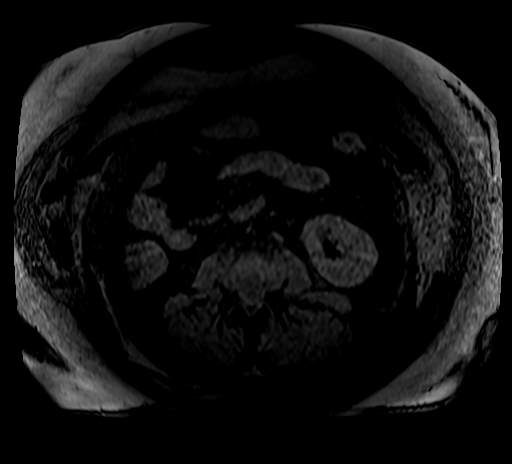
[im 44/88]
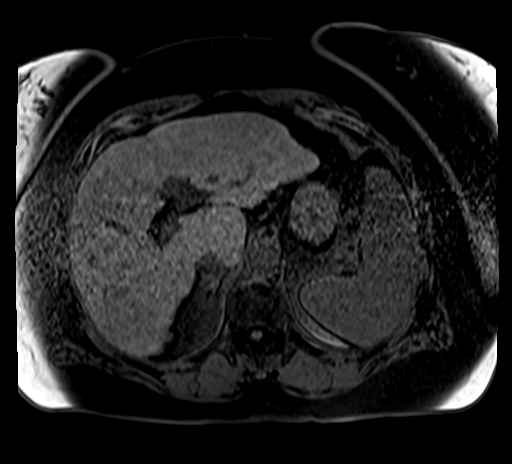
[im 88/88]
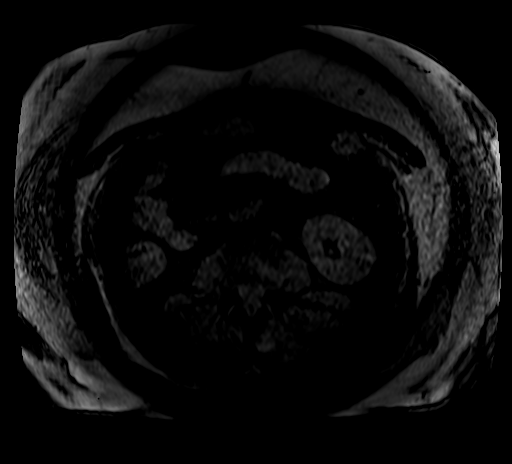

[Series 12: post 25 sec · axial · 2.4mm · 0.78mm/px · z∈[-95,+113]mm · 4 of 88 slices shown]
[im 1/88]
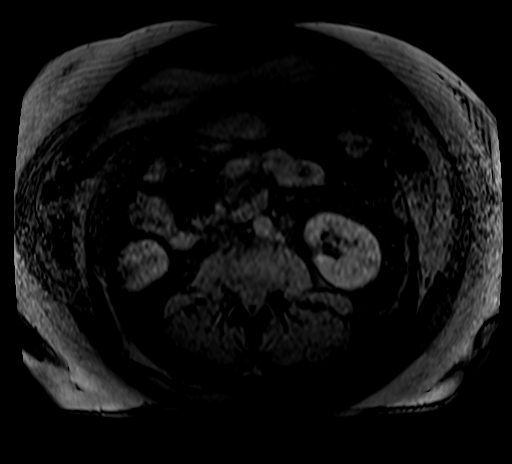
[im 30/88]
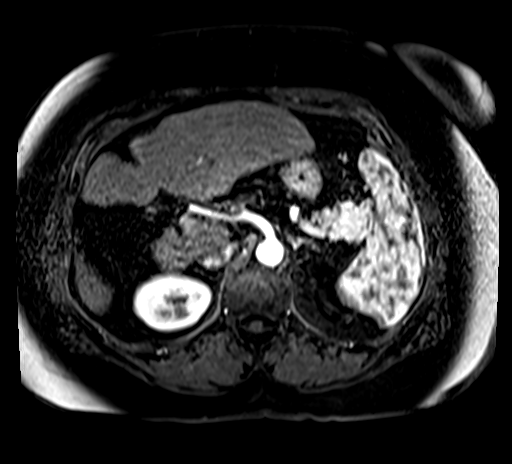
[im 59/88]
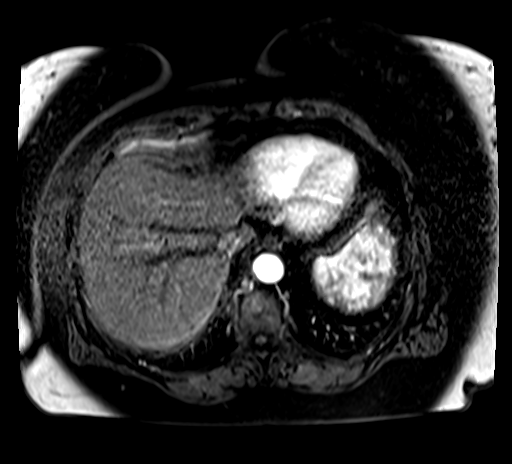
[im 88/88]
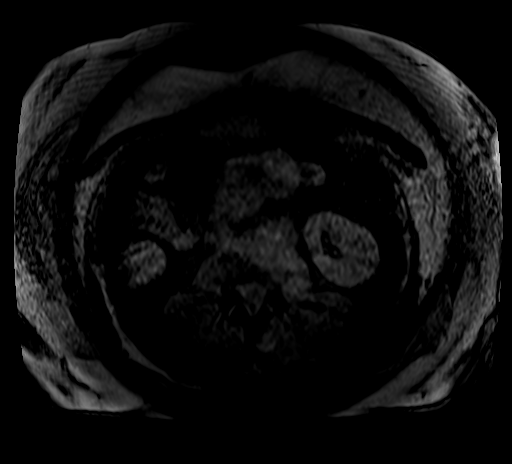

[Series 13: post 25 sec_sub · axial · 2.4mm · 0.78mm/px · z∈[-95,+113]mm · 4 of 88 slices shown]
[im 1/88]
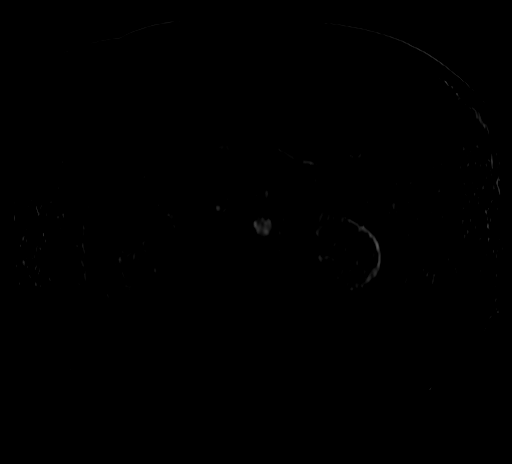
[im 30/88]
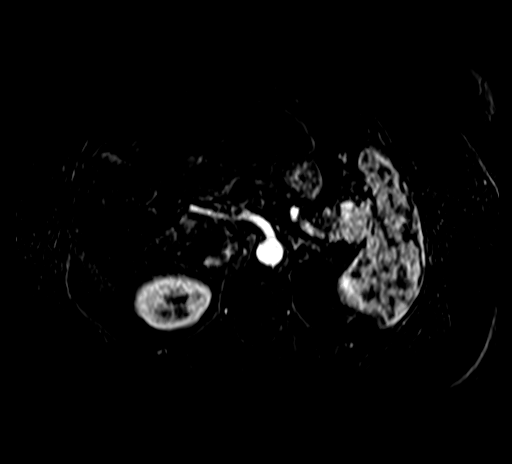
[im 59/88]
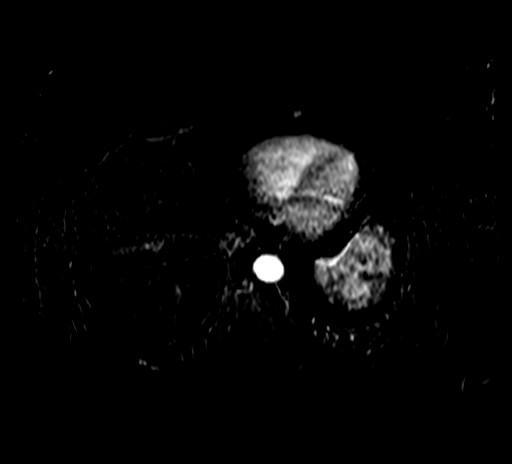
[im 88/88]
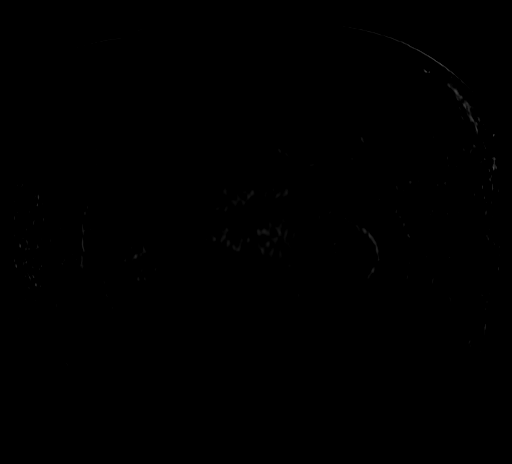

[Series 14: post 45 sec · axial · 2.4mm · 0.78mm/px · z∈[-95,+113]mm · 4 of 88 slices shown]
[im 1/88]
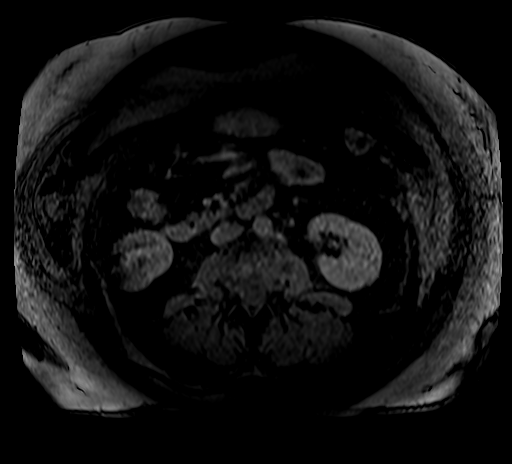
[im 30/88]
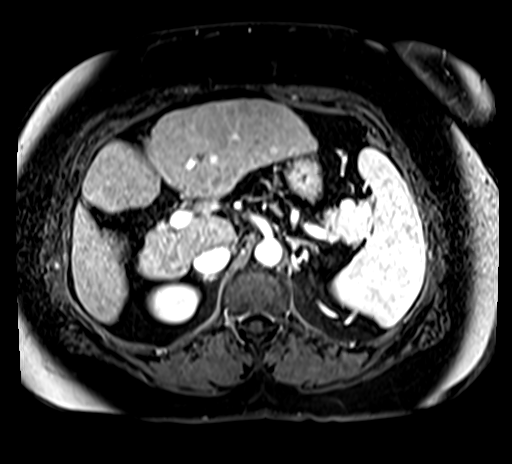
[im 59/88]
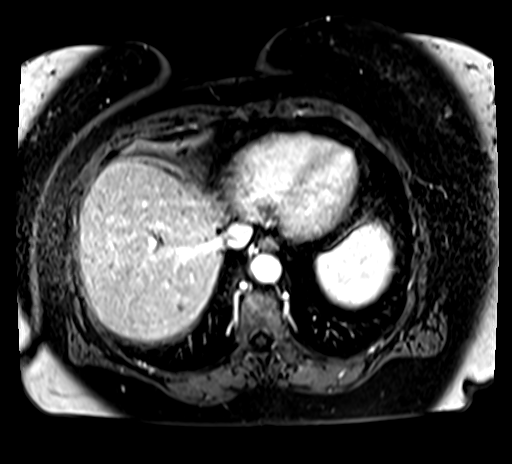
[im 88/88]
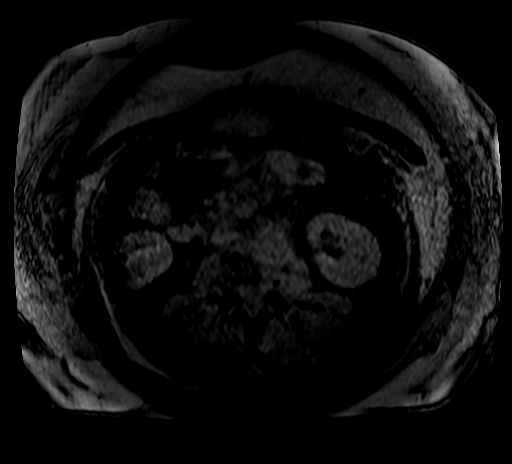

[25 of 48 positions shown; findings below may reference images not displayed]

FINDINGS: Lower chest: No acute findings.

Hepatobiliary: Cirrhotic hepatic morphology, similar prior. No
arterially enhancing hepatic lesions. Gallbladder surgically absent.
No biliary ductal dilation.

Pancreas:  No pancreatic ductal dilation.  No inflammatory change.

No significant change in the enhancing soft tissue mass in the
splenic hilum which appears intimately associated with the tail the
pancreas measuring 4.0 x 3.1 cm on image 55/14 previously 4.1 x
cm, grossly stable in size dating back to [AC]. Also unchanged is
the central cystic component which measures 2.1 x 1.3 cm on image
[DATE]. The solid component of the lesion is similar in intrinsic
signal intensity to spleen on all sequences, with an enhancement
pattern similar to spleen throughout all phases of acquired
postcontrast images as well as similar reduced diffusivity. Given
the long-term stability this is once again favored benign with broad
differential considerations primarily including accessory splenule
containing a benign cystic lesion or degenerated component or
primary pancreatic neoplasm such as solid pseudopapillary tumor or
degenerating neuroendocrine tumor.

Spleen:  Within normal limits in size and appearance.

Adrenals/Urinary Tract: No masses identified. No evidence of
hydronephrosis.

Stomach/Bowel: Colonic diverticulosis otherwise the visualized
portions within the abdomen are unremarkable.

Vascular/Lymphatic: No abdominal aortic aneurysm. The portal,
hepatic, splenic and superior mesenteric veins appear patent. No
pathologically enlarged abdominal or pelvic lymph nodes.

Other:  None.

Musculoskeletal: No suspicious bone lesions identified.
IMPRESSION: 1. Enhancing 4 cm soft tissue mass in the splenic hilum which
appears intimately associated with the tail the pancreas measuring,
stable dating back to [AC]. Given the long-term stability this is
once again favored to represent a benign etiology with broad
differential considerations, but favored to represent an accessory
splenule containing a benign cystic lesion or degenerated component
with less likely considerations including a primary pancreatic
neoplasm such as solid pseudopapillary tumor or indolent
neuroendocrine tumor. More definitive characterization could be
obtained with nuclear medicine sulfur colloid scan with SPECT CT
imaging if clinically indicated.
2. Cirrhotic hepatic morphology. No arterially enhancing hepatic
lesions.

## 2020-10-29 MED ORDER — GADOBENATE DIMEGLUMINE 529 MG/ML IV SOLN
20.0000 mL | Freq: Once | INTRAVENOUS | Status: AC | PRN
  Administered 2020-10-29: 20 mL via INTRAVENOUS

## 2020-12-12 ENCOUNTER — Encounter: Payer: Self-pay | Admitting: Gastroenterology

## 2021-02-03 ENCOUNTER — Encounter: Payer: Self-pay | Admitting: Gastroenterology

## 2021-03-06 ENCOUNTER — Emergency Department (HOSPITAL_COMMUNITY)
Admission: EM | Admit: 2021-03-06 | Discharge: 2021-03-06 | Disposition: A | Discharge status: Home / Self Care | Payer: Medicare Other | Attending: Emergency Medicine | Admitting: Emergency Medicine

## 2021-03-06 ENCOUNTER — Other Ambulatory Visit: Payer: Self-pay

## 2021-03-06 ENCOUNTER — Emergency Department (HOSPITAL_COMMUNITY): Payer: Medicare Other

## 2021-03-06 DIAGNOSIS — R072 Precordial pain: Secondary | ICD-10-CM | POA: Insufficient documentation

## 2021-03-06 DIAGNOSIS — Z79899 Other long term (current) drug therapy: Secondary | ICD-10-CM | POA: Diagnosis not present

## 2021-03-06 DIAGNOSIS — Z8542 Personal history of malignant neoplasm of other parts of uterus: Secondary | ICD-10-CM | POA: Diagnosis not present

## 2021-03-06 DIAGNOSIS — R1013 Epigastric pain: Secondary | ICD-10-CM | POA: Diagnosis not present

## 2021-03-06 DIAGNOSIS — R079 Chest pain, unspecified: Secondary | ICD-10-CM

## 2021-03-06 DIAGNOSIS — I10 Essential (primary) hypertension: Secondary | ICD-10-CM | POA: Insufficient documentation

## 2021-03-06 LAB — BASIC METABOLIC PANEL
Anion gap: 9 (ref 5–15)
BUN: 12 mg/dL (ref 8–23)
CO2: 28 mmol/L (ref 22–32)
Calcium: 9.5 mg/dL (ref 8.9–10.3)
Chloride: 102 mmol/L (ref 98–111)
Creatinine, Ser: 0.68 mg/dL (ref 0.44–1.00)
GFR, Estimated: >60 mL/min (ref >60)
Glucose, Bld: 103 mg/dL — ABNORMAL HIGH (ref 70–99)
Potassium: 3.7 mmol/L (ref 3.5–5.1)
Sodium: 139 mmol/L (ref 135–145)

## 2021-03-06 LAB — TROPONIN I (HIGH SENSITIVITY)
Troponin I (High Sensitivity): 5 ng/L (ref <18)
Troponin I (High Sensitivity): 5 ng/L (ref <18)

## 2021-03-06 LAB — CBC
HCT: 44.3 % (ref 36.0–46.0)
Hemoglobin: 14.4 g/dL (ref 12.0–15.0)
MCH: 31.4 pg (ref 26.0–34.0)
MCHC: 32.5 g/dL (ref 30.0–36.0)
MCV: 96.5 fL (ref 80.0–100.0)
Platelets: 137 10*3/uL — ABNORMAL LOW (ref 150–400)
RBC: 4.59 MIL/uL (ref 3.87–5.11)
RDW: 13.2 % (ref 11.5–15.5)
WBC: 4.8 10*3/uL (ref 4.0–10.5)
nRBC: 0 % (ref 0.0–0.2)

## 2021-03-06 IMAGING — DX DG CHEST 2V
2 series · 2 of 2 positions shown · non-contrast
Comparison: [DATE].

CLINICAL DATA: Chest pain.

EXAM:
CHEST - 2 VIEW

[chest pa]
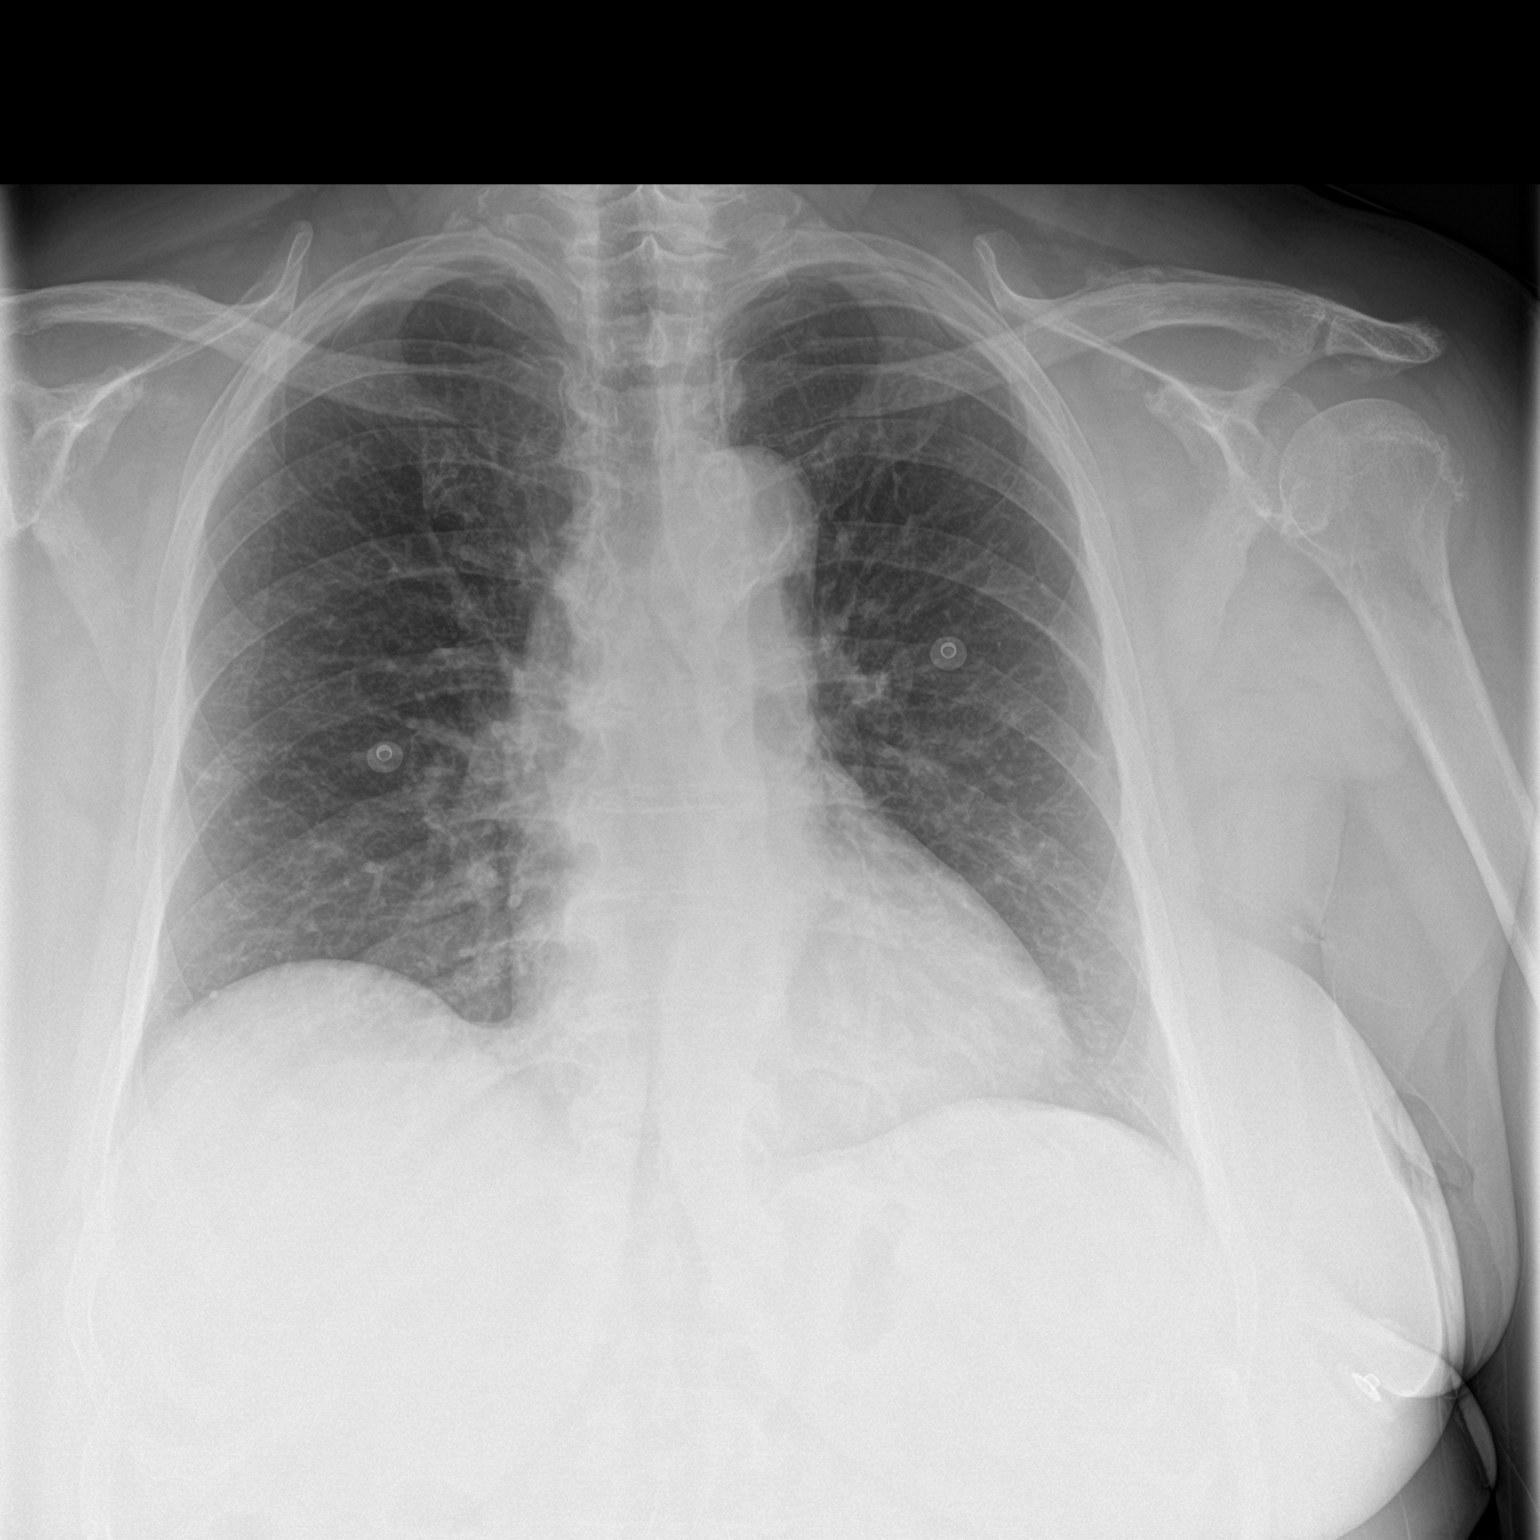

[chest lat]
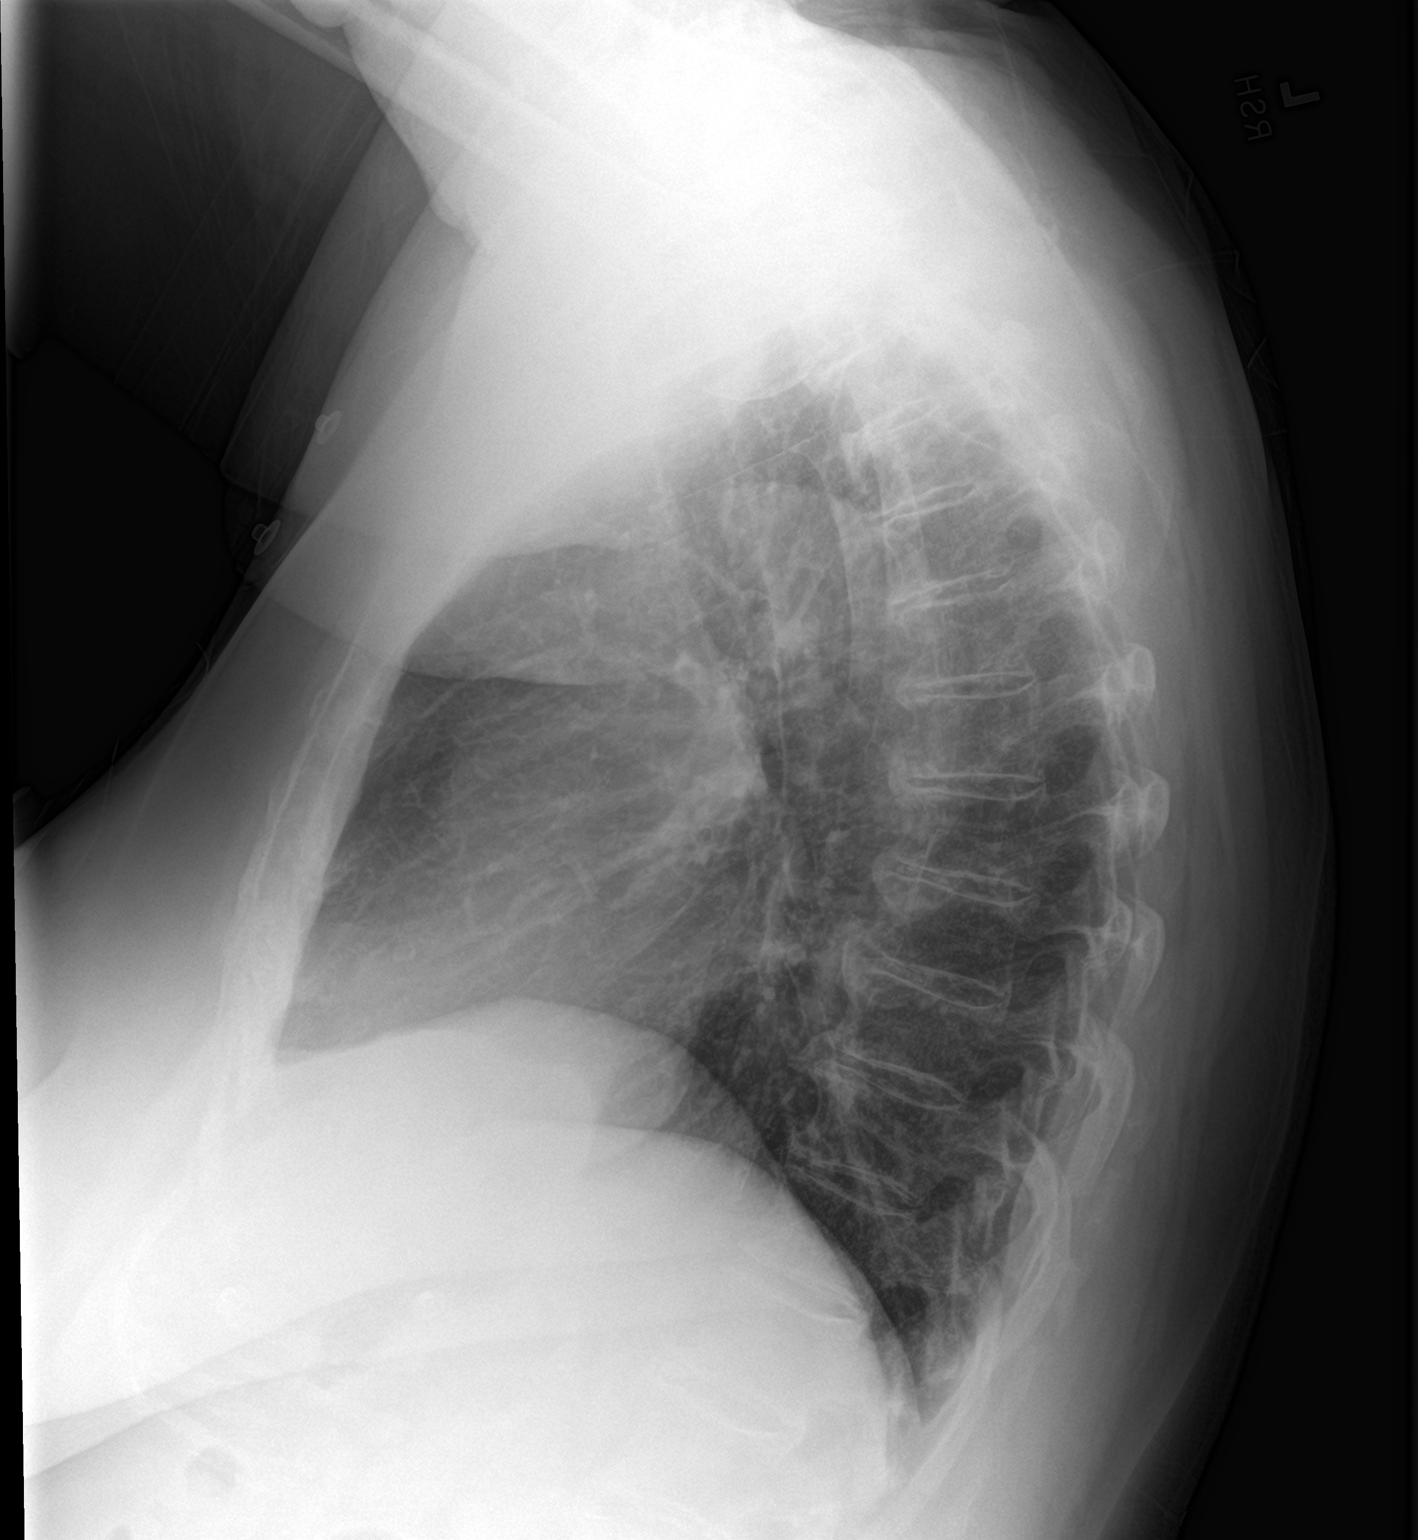

[2 of 2 positions shown; findings below may reference images not displayed]

FINDINGS: The heart size and mediastinal contours are within normal limits.
Both lungs are clear. The visualized skeletal structures are
unremarkable.
IMPRESSION: No active cardiopulmonary disease.

## 2021-03-06 IMAGING — CT CT ANGIO CHEST-ABD-PELV FOR DISSECTION W/ AND WO/W CM
2 of 7 series · 14 of 46 positions shown, 16 images · IV contrast (OMNI 350)
Comparison: MRI abdomen dated [DATE]; CT abdomen and pelvis
dated [DATE]

CLINICAL DATA: Chest/back pain

EXAM:
CT ANGIOGRAPHY CHEST, ABDOMEN AND PELVIS
TECHNIQUE: Non-contrast CT of the chest was initially obtained.

[Series 7: dissection 2mm · axial · 0.80mm/px · z∈[-592,-38]mm · 11 of 313 slices shown, 13 images]
[im 18/313  soft-tissue]
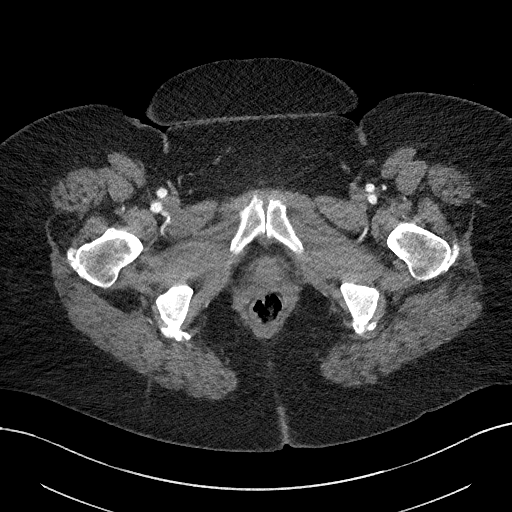
[im 18/313  bone]
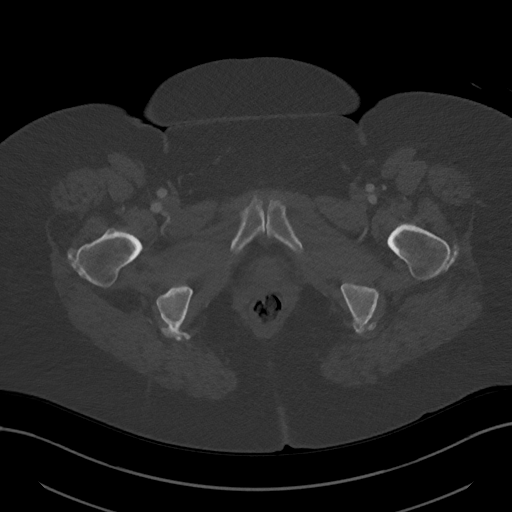
[im 53/313  soft-tissue]
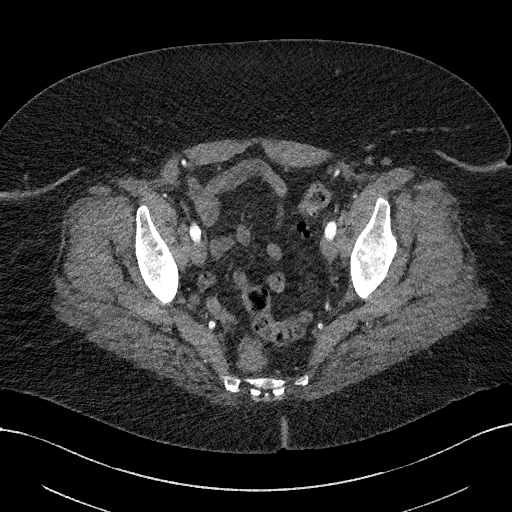
[im 70/313  soft-tissue]
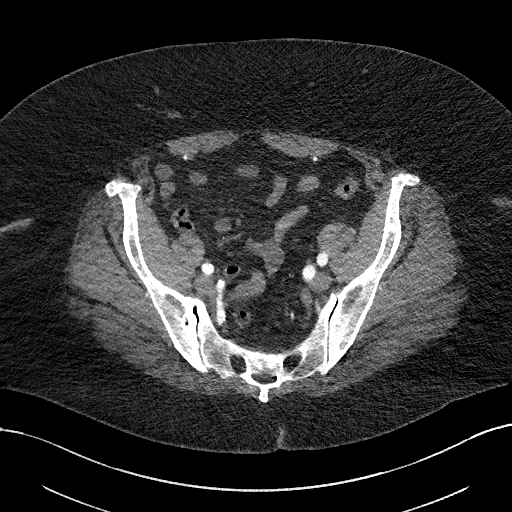
[im 105/313  soft-tissue]
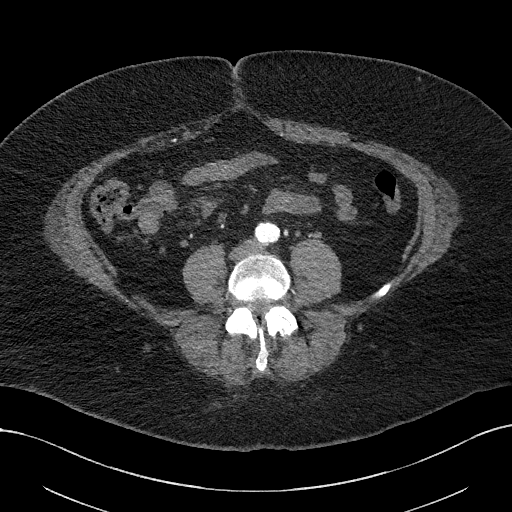
[im 122/313  soft-tissue]
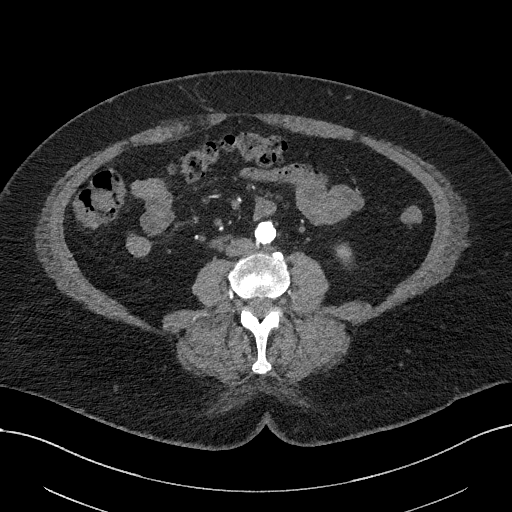
[im 157/313  soft-tissue]
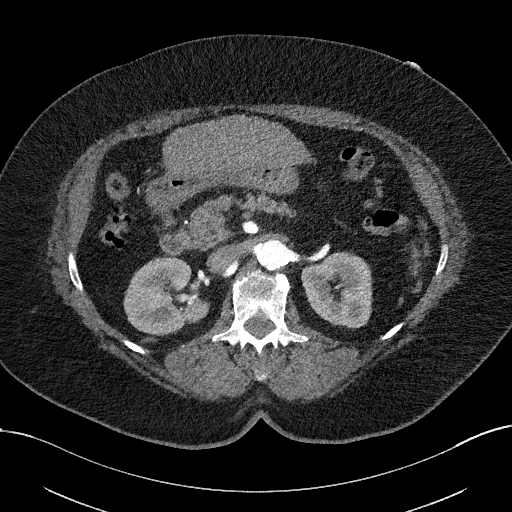
[im 191/313  soft-tissue]
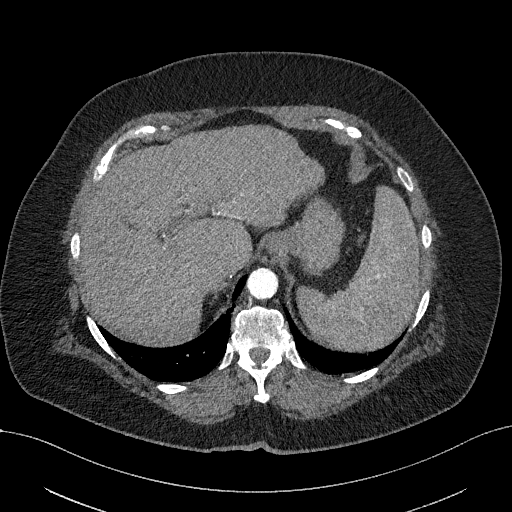
[im 209/313  soft-tissue]
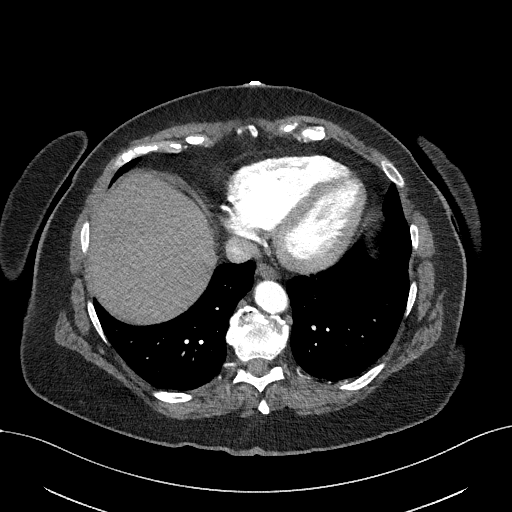
[im 243/313  soft-tissue]
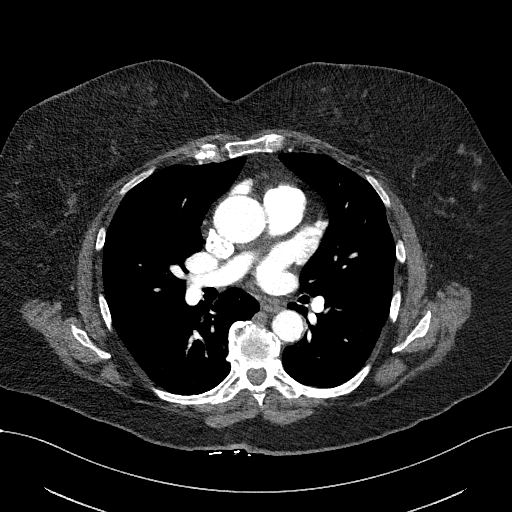
[im 243/313  bone]
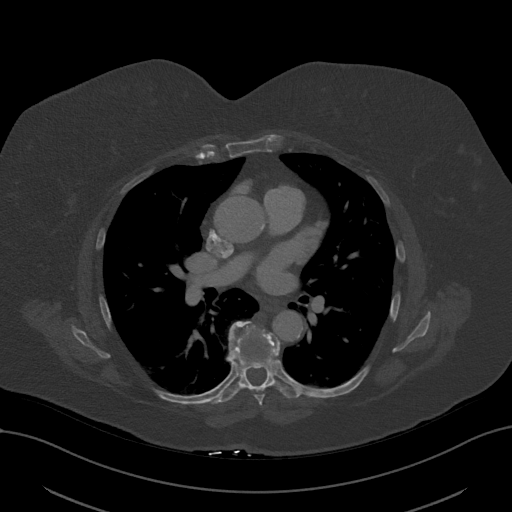
[im 261/313  soft-tissue]
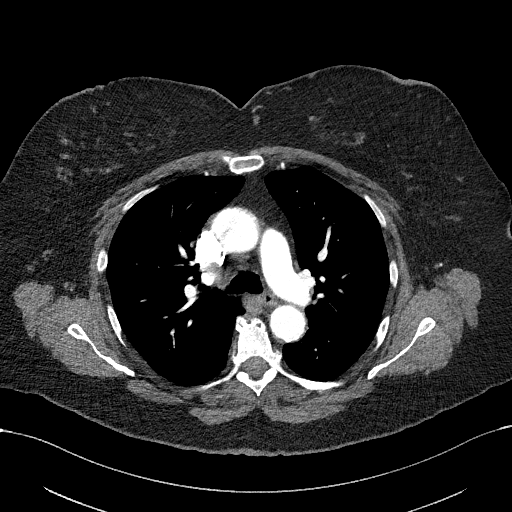
[im 295/313  soft-tissue]
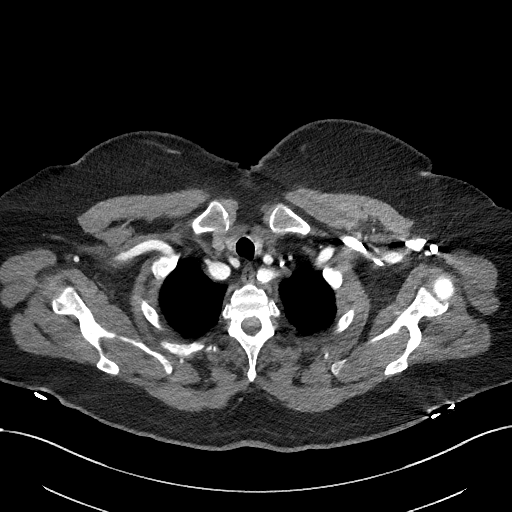

[Series 10: dissection 2mm cor · coronal · 1.00mm/px · 3 of 165 slices shown]
[im 42/165  soft-tissue]
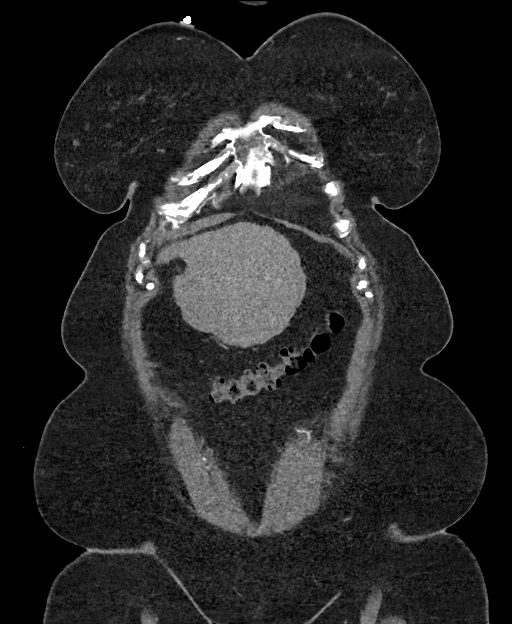
[im 83/165  soft-tissue]
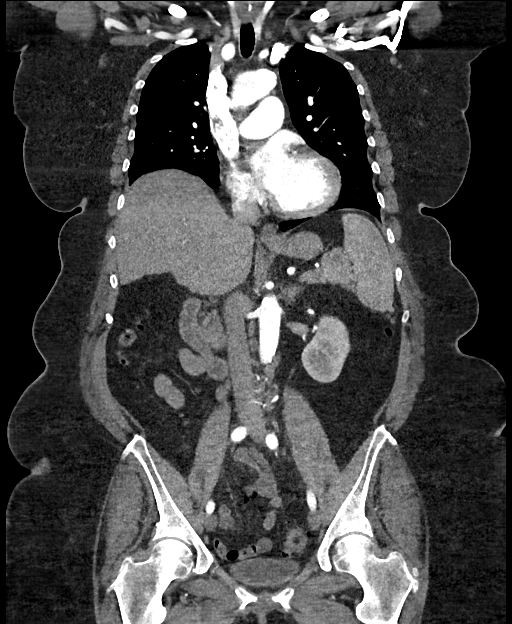
[im 124/165  soft-tissue]
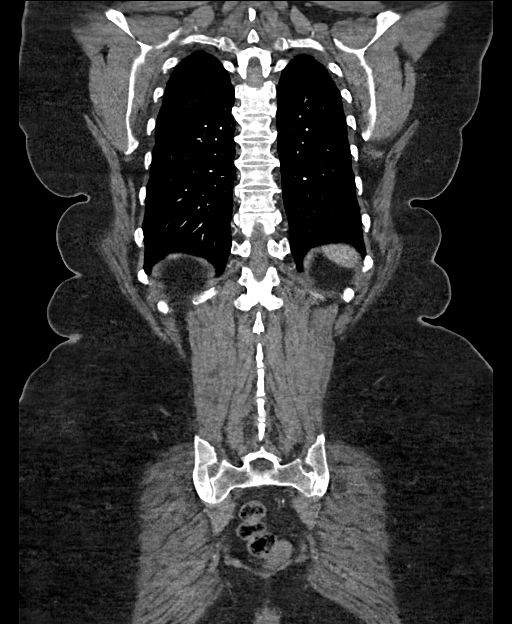

[14 of 46 positions shown; findings below may reference images not displayed]

Multidetector CT imaging through the chest, abdomen and pelvis was
performed using the standard protocol during bolus administration of
intravenous contrast. Multiplanar reconstructed images and MIPs were
obtained and reviewed to evaluate the vascular anatomy.

CONTRAST:  100mL OMNIPAQUE IOHEXOL 350 MG/ML SOLN
FINDINGS: CTA CHEST FINDINGS

Cardiovascular: Normal heart size. No pericardial effusion. Good
contrast opacification of the pulmonary arteries with no filling
defects to suggest pulmonary embolus. Three-vessel coronary artery
calcifications.

Mediastinum/Nodes: No pathologically enlarged lymph nodes seen in
the chest. Esophagus and thyroid are unremarkable.

Lungs/Pleura: Central airways are patent. No consolidation, pleural
effusion or pneumothorax. Mild ground-glass opacities of the medial
right lower lobe, likely due to osteophyte fibrosis.

Musculoskeletal: No chest wall abnormality. No acute or significant
osseous findings.

Review of the MIP images confirms the above findings.

CTA ABDOMEN AND PELVIS FINDINGS

VASCULAR

Aorta: Normal caliber aorta is no evidence of dissection or
intramural hematoma. Mild calcified and noncalcified atherosclerotic
disease. As reference, ascending thoracic aorta measures 3.5 cm
descending thoracic aorta measures 2.3 cm suprarenal abdominal aorta
measures 2.1 cm and infrarenal abdominal aorta measures 0.5 cm.

Celiac: Patent with no evidence of aneurysm or dissection. Mild
narrowing at the origin due to calcified plaque.

SMA: Patent with no evidence of aneurysm or dissection. Mild
narrowing at the origin due to calcified plaque.

Renals: Single bilateral renal arteries are patent with no evidence
of aneurysm or dissection. Mild narrowing of the origin of the left
renal artery due to calcified plaque.

IMA: Patent with no evidence of aneurysm or dissection. No
significant stenosis.

Iliofemoral: Patent with no evidence of aneurysm or dissection. Mild
scattered calcified plaque causing no significant stenosis.

Veins: No obvious venous abnormality within the limitations of this
arterial phase study.

Review of the MIP images confirms the above findings.

NON-VASCULAR

Hepatobiliary: Nodular liver contour. No suspicious liver lesions.
Gallbladder is surgically absent. No biliary ductal dilation.

Pancreas: Unremarkable. No pancreatic ductal dilatation or
surrounding inflammatory changes.

Spleen: Unchanged splenic hilum soft tissue mass, better evaluated
on prior MRI. Mild splenomegaly, measuring up to 14.0 cm.

Adrenals/Urinary Tract: Adrenal glands are unremarkable. Kidneys are
normal, without renal calculi, focal lesion, or hydronephrosis.
Bladder is unremarkable.

Stomach/Bowel: Stomach is within normal limits. Appendix appears
normal. Scattered colonic diverticula. No evidence of bowel wall
thickening, distention, or inflammatory changes.

Lymphatic: No pathologically enlarged lymph nodes seen in the
abdomen or pelvis

Reproductive: Status post hysterectomy. No adnexal masses.

Other: No abdominal wall hernia or abnormality. No abdominopelvic
ascites.

Musculoskeletal: No acute or significant osseous findings.

Review of the MIP images confirms the above findings.
IMPRESSION: Normal caliber aorta with no evidence of acute aortic syndrome.

Cirrhotic liver morphology with splenomegaly.

Aortic Atherosclerosis ([AX]-[AX]).

## 2021-03-06 MED ORDER — FAMOTIDINE 20 MG PO TABS: 20.0000 mg | ORAL_TABLET | Freq: Two times a day (BID) | ORAL | 0 refills | Status: DC

## 2021-03-06 MED ORDER — IOHEXOL 350 MG/ML SOLN
100.0000 mL | Freq: Once | INTRAVENOUS | Status: AC | PRN
  Administered 2021-03-06: 100 mL via INTRAVENOUS

## 2021-03-06 NOTE — ED Notes (Signed)
Alert, NAD, calm, interactive, resps e/u, steady gait

## 2021-03-06 NOTE — ED Provider Notes (Signed)
Emergency Medicine Provider Triage Evaluation Note  Claudia Weber , a 70 y.o. female  was evaluated in triage.  Pt complains of chest pain x3 days and neck and jaw pain on the left.  Today she began having numbness and tingling down the left arm.  No weakness or other neurologic complaints.  Notably hypertensive, history of diabetes, hypertension hyperlipidemia..  Review of Systems  Positive: Chest pain, paresthesia Negative: Diaphoresis, nausea or vomiting  Physical Exam  BP (!) 192/82 (BP Location: Right Arm)   Pulse 65   Temp 98 F (36.7 C) (Oral)   Resp 16   SpO2 100%  Gen:   Awake, no distress   Resp:  Normal effort  MSK:   Moves extremities without difficulty  Other:  RRR  Medical Decision Making  Medically screening exam initiated at 9:45 AM.  Appropriate orders placed.  Claudia Weber was informed that the remainder of the evaluation will be completed by another provider, this initial triage assessment does not replace that evaluation, and the importance of remaining in the ED until their evaluation is complete.  Work up initiated. Hypertensive, paresthesia EKG without ischemia   Margarita Mail, PA-C 03/06/21 [id number]    Regan Lemming, MD 03/06/21 1050

## 2021-03-06 NOTE — Discharge Instructions (Addendum)
Return for any problem.  ?

## 2021-03-06 NOTE — ED Notes (Signed)
Pt discharged and ambulated out of the ED without difficulty. 

## 2021-03-06 NOTE — ED Provider Notes (Signed)
[facility] Provider Note   CSN: [id number] Arrival date & time: 03/06/21  6213     History Chief Complaint  Patient presents with   Chest Pain    Claudia Weber is a 70 y.o. female.  70 year old female with prior medical history as detailed below presents for evaluation.  Patient complains of constant midsternal and epigastric discomfort for the last 2 days.  Patient reports that her symptoms are similar to what she is experienced from heartburn previously.  Her symptoms were more constant than typical however.  She denies associated nausea or vomiting.  She denies diaphoresis.  She denies shortness of breath.  She denies prior known history of CAD.  The history is provided by the patient.  Chest Pain Pain location:  Substernal area and epigastric Pain quality: aching and burning   Pain radiates to:  Does not radiate Pain severity:  No pain Onset quality:  Gradual Duration:  2 days Timing:  Constant Progression:  Unchanged Chronicity:  New     Past Medical History:  Diagnosis Date   Cat scratch fever    Diverticulosis    Elevated LFTs    Hepatic cirrhosis (HCC)    High blood pressure    Other specified disorders of thyroid    Uterine cancer [facility])     Patient Active Problem List   Diagnosis Date Noted   LLQ abdominal pain 11/20/2019   Diverticulitis of colon without hemorrhage 11/20/2019   Steatohepatitis, non-alcoholic 08/65/7846   Cirrhosis of liver without ascites ([place]) 06/06/2018    Past Surgical History:  Procedure Laterality Date   ABDOMINAL HYSTERECTOMY  11/2016   BLADDER SURGERY     polyp removed   Breast cyst removal     CHOLECYSTECTOMY     KNEE SURGERY Left    arthroscopic   LIVER BIOPSY     right knee surgery       OB History   No obstetric history on file.     Family History  Problem Relation Age of Onset   Throat cancer Mother    Lung cancer Mother    Colitis Mother        resection    Hepatitis Brother    Heart disease Maternal Grandmother    Heart disease Maternal Grandfather    Uterine cancer Sister    Liver cancer Cousin    Leukemia Cousin    Lung cancer Cousin    Colon cancer Cousin    Stomach cancer Neg Hx    Pancreatic cancer Neg Hx     Social History   Tobacco Use   Smoking status: Never   Smokeless tobacco: Never  Vaping Use   Vaping Use: Never used  Substance Use Topics   Alcohol use: Not Currently   Drug use: Never    Home Medications Prior to Admission medications   Medication Sig Start Date End Date Taking? Authorizing Provider  famotidine (PEPCID) 20 MG tablet Take 1 tablet (20 mg total) by mouth 2 (two) times daily. 03/06/21  Yes Valarie Merino, MD  acetaminophen (TYLENOL) 500 MG tablet Take 1,000 mg by mouth every 6 (six) hours as needed for moderate pain.    [provider]  amoxicillin-clavulanate (AUGMENTIN) 875-125 MG tablet Take 1 tablet by mouth 2 (two) times daily. 11/20/19   Zehr, Laban Emperor, PA-C  Cyanocobalamin (VITAMIN B-12) 2000 MCG TBCR Take 2,000 mcg by mouth daily.    [provider]  levothyroxine (SYNTHROID, LEVOTHROID) 50 MCG  tablet Take 50 mcg by mouth at bedtime.     [provider]  lisinopril (PRINIVIL,ZESTRIL) 20 MG tablet Take 20 mg by mouth at bedtime.     [provider]  magnesium oxide (MAG-OX) 400 (241.3 Mg) MG tablet Take 400 mg by mouth at bedtime.  01/04/19   [provider]  metroNIDAZOLE (FLAGYL) 500 MG tablet Take 500 mg by mouth 3 (three) times daily.    [provider]  Omega-3 1000 MG CAPS Take 1 g by mouth at bedtime.     [provider]  omeprazole (PRILOSEC) 20 MG capsule Take 1 capsule (20 mg total) by mouth daily. 04/15/19 05/15/19  Madilyn Hook A, PA-C  VITAMIN D PO Take 1 tablet by mouth at bedtime.    [provider]    Allergies    Codeine and Nsaids  Review of Systems   Review of Systems  Cardiovascular:  Positive for  chest pain.  All other systems reviewed and are negative.  Physical Exam Updated Vital Signs BP 134/82   Pulse 69   Temp 98 F (36.7 C) (Oral)   Resp 13   SpO2 96%   Physical Exam Vitals and nursing note reviewed.  Constitutional:      General: She is not in acute distress.    Appearance: Normal appearance. She is well-developed.  HENT:     Head: Normocephalic and atraumatic.  Eyes:     Conjunctiva/sclera: Conjunctivae normal.     Pupils: Pupils are equal, round, and reactive to light.  Cardiovascular:     Rate and Rhythm: Normal rate and regular rhythm.     Heart sounds: Normal heart sounds.  Pulmonary:     Effort: Pulmonary effort is normal. No respiratory distress.     Breath sounds: Normal breath sounds.  Abdominal:     General: There is no distension.     Palpations: Abdomen is soft.     Tenderness: There is no abdominal tenderness.  Musculoskeletal:        General: No deformity. Normal range of motion.     Cervical back: Normal range of motion and neck supple.  Skin:    General: Skin is warm and dry.  Neurological:     General: No focal deficit present.     Mental Status: She is alert and oriented to person, place, and time.    ED Results / Procedures / Treatments   Labs (all labs ordered are listed, but only abnormal results are displayed) Labs Reviewed  BASIC METABOLIC PANEL - Abnormal; Notable for the following components:      Result Value   Glucose, Bld 103 (*)    All other components within normal limits  CBC - Abnormal; Notable for the following components:   Platelets 137 (*)    All other components within normal limits  TROPONIN I (HIGH SENSITIVITY)  TROPONIN I (HIGH SENSITIVITY)    EKG EKG Interpretation  Date/Time:  Friday March 06 2021 09:33:10 EDT Ventricular Rate:  65 PR Interval:  152 QRS Duration: 88 QT Interval:  434 QTC Calculation: 451 R Axis:   75 Text Interpretation: Normal sinus rhythm Normal ECG Confirmed by Dene Gentry [phone]) on 03/06/2021 6:40:33 PM  Radiology DG Chest 2 View  Result Date: 03/06/2021 CLINICAL DATA:  Chest pain. EXAM: CHEST - 2 VIEW COMPARISON:  April 15, 2019. FINDINGS: The heart size and mediastinal contours are within normal limits. Both lungs are clear. The visualized skeletal structures are unremarkable. IMPRESSION: No  active cardiopulmonary disease. Electronically Signed   By: Marijo Conception M.D.   On: 03/06/2021 10:42   CT Angio Chest/Abd/Pel for Dissection W and/or Wo Contrast  Result Date: 03/06/2021 CLINICAL DATA:  Chest/back pain EXAM: CT ANGIOGRAPHY CHEST, ABDOMEN AND PELVIS TECHNIQUE: Non-contrast CT of the chest was initially obtained. Multidetector CT imaging through the chest, abdomen and pelvis was performed using the standard protocol during bolus administration of intravenous contrast. Multiplanar reconstructed images and MIPs were obtained and reviewed to evaluate the vascular anatomy. CONTRAST:  166m OMNIPAQUE IOHEXOL 350 MG/ML SOLN COMPARISON:  MRI abdomen dated October 29, 2020; CT abdomen and pelvis dated April 15, 2019 FINDINGS: CTA CHEST FINDINGS Cardiovascular: Normal heart size. No pericardial effusion. Good contrast opacification of the pulmonary arteries with no filling defects to suggest pulmonary embolus. Three-vessel coronary artery calcifications. Mediastinum/Nodes: No pathologically enlarged lymph nodes seen in the chest. Esophagus and thyroid are unremarkable. Lungs/Pleura: Central airways are patent. No consolidation, pleural effusion or pneumothorax. Mild ground-glass opacities of the medial right lower lobe, likely due to osteophyte fibrosis. Musculoskeletal: No chest wall abnormality. No acute or significant osseous findings. Review of the MIP images confirms the above findings. CTA ABDOMEN AND PELVIS FINDINGS VASCULAR Aorta: Normal caliber aorta is no evidence of dissection or intramural hematoma. Mild calcified and noncalcified atherosclerotic disease.  As reference, ascending thoracic aorta measures 3.5 cm descending thoracic aorta measures 2.3 cm suprarenal abdominal aorta measures 2.1 cm and infrarenal abdominal aorta measures 0.5 cm. Celiac: Patent with no evidence of aneurysm or dissection. Mild narrowing at the origin due to calcified plaque. SMA: Patent with no evidence of aneurysm or dissection. Mild narrowing at the origin due to calcified plaque. Renals: Single bilateral renal arteries are patent with no evidence of aneurysm or dissection. Mild narrowing of the origin of the left renal artery due to calcified plaque. IMA: Patent with no evidence of aneurysm or dissection. No significant stenosis. Iliofemoral: Patent with no evidence of aneurysm or dissection. Mild scattered calcified plaque causing no significant stenosis. Veins: No obvious venous abnormality within the limitations of this arterial phase study. Review of the MIP images confirms the above findings. NON-VASCULAR Hepatobiliary: Nodular liver contour. No suspicious liver lesions. Gallbladder is surgically absent. No biliary ductal dilation. Pancreas: Unremarkable. No pancreatic ductal dilatation or surrounding inflammatory changes. Spleen: Unchanged splenic hilum soft tissue mass, better evaluated on prior MRI. Mild splenomegaly, measuring up to 14.0 cm. Adrenals/Urinary Tract: Adrenal glands are unremarkable. Kidneys are normal, without renal calculi, focal lesion, or hydronephrosis. Bladder is unremarkable. Stomach/Bowel: Stomach is within normal limits. Appendix appears normal. Scattered colonic diverticula. No evidence of bowel wall thickening, distention, or inflammatory changes. Lymphatic: No pathologically enlarged lymph nodes seen in the abdomen or pelvis Reproductive: Status post hysterectomy. No adnexal masses. Other: No abdominal wall hernia or abnormality. No abdominopelvic ascites. Musculoskeletal: No acute or significant osseous findings. Review of the MIP images confirms the  above findings. IMPRESSION: Normal caliber aorta with no evidence of acute aortic syndrome. Cirrhotic liver morphology with splenomegaly. Aortic Atherosclerosis (ICD10-I70.0). Electronically Signed   By: LYetta GlassmanM.D.   On: 03/06/2021 13:55    Procedures Procedures   Medications Ordered in ED Medications  iohexol (OMNIPAQUE) 350 MG/ML injection 100 mL (100 mLs Intravenous Contrast Given 03/06/21 1300)    ED Course  I have reviewed the triage vital signs and the nursing notes.  Pertinent labs & imaging results that were available during my care of the patient were reviewed by me and  considered in my medical decision making (see chart for details).  Clinical Course as of 03/06/21 1945  Fri Mar 06, 2021  1839 MCHC: 32.5 [PM]    Clinical Course User Index [PM] Valarie Merino, MD   MDM Rules/Calculators/A&P                           MDM  MSE complete  Claudia Weber was evaluated in Emergency Department on 03/06/2021 for the symptoms described in the history of present illness. She was evaluated in the context of the global COVID-19 pandemic, which necessitated consideration that the patient might be at risk for infection with the SARS-CoV-2 virus that causes COVID-19. Institutional protocols and algorithms that pertain to the evaluation of patients at risk for COVID-19 are in a state of rapid change based on information released by regulatory bodies including the CDC and federal and state organizations. These policies and algorithms were followed during the patient's care in the ED.  Patient is presenting with reported chest discomfort.  Patient's presentation is not consistent with likely ACS.  EKG is without evidence of acute ischemia.  Troponin x2 is nearly undetectable.  Patient has had a lengthy wait time prior to evaluation.  She feels improved at time of evaluation.  She was offered further observation and/or overnight admission.  She declined same.  She does understand  need for close follow-up both with her PCP and also with cardiology in the outpatient setting.  Strict return precautions given and understood.  Patient is agreeable to trial of antiacid medication to see if it helps her described symptoms.  Importance of close follow-up is repeatedly stressed --  especially with cardiology. Final Clinical Impression(s) / ED Diagnoses Final diagnoses:  Chest pain, unspecified type    Rx / DC Orders ED Discharge Orders          Ordered    Ambulatory referral to Cardiology        03/06/21 1939    famotidine (PEPCID) 20 MG tablet  2 times daily        03/06/21 1939             Valarie Merino, MD 03/06/21 1947

## 2021-03-06 NOTE — ED Triage Notes (Signed)
Pt with constant central chest pain with radiation to L arm and neck x 2 days. Pain worse when lying down. Endorses intermittent dizziness. Unable to take ASA d/t cirrhosis.

## 2021-06-05 ENCOUNTER — Telehealth: Payer: Self-pay | Admitting: Gastroenterology

## 2021-06-05 NOTE — Telephone Encounter (Signed)
Patient called to follow up on lab results that were faxed over from [facility] seeking further treatment plan.

## 2021-06-05 NOTE — Telephone Encounter (Signed)
Patient with cirrhosis.  Had labs with her PCP, she is requesting an appointment. She has been scheduled for 06/30/21 9:10

## 2021-06-30 ENCOUNTER — Encounter: Payer: Self-pay | Admitting: Gastroenterology

## 2021-06-30 ENCOUNTER — Other Ambulatory Visit (INDEPENDENT_AMBULATORY_CARE_PROVIDER_SITE_OTHER): Payer: Medicare HMO

## 2021-06-30 ENCOUNTER — Ambulatory Visit: Payer: Medicare HMO | Admitting: Gastroenterology

## 2021-06-30 VITALS — BP 140/86 | HR 76 | Ht 65.0 in | Wt 279.0 lb

## 2021-06-30 DIAGNOSIS — K7460 Unspecified cirrhosis of liver: Secondary | ICD-10-CM | POA: Diagnosis not present

## 2021-06-30 LAB — PROTIME-INR
INR: 1.2 ratio — ABNORMAL HIGH (ref 0.8–1.0)
Prothrombin Time: 12.7 s (ref 9.6–13.1)

## 2021-06-30 NOTE — Progress Notes (Signed)
° ° °  History of Present Illness: This is a 71 year old female returning for follow-up of Claudia Weber cirrhosis.  She is accompanied by her husband.  A slightly low platelet count has been noted with the most recent values of 137 and 119.  CT scan in October showed mild splenomegaly measuring 14 cm.  She is following a carb modified, fat modified diet however she has not been able to lose weight.  She is not able to exercise due to knee problems.  Current Medications, Allergies, Past Medical History, Past Surgical History, Family History and Social History were reviewed in [employer] record.   Physical Exam: General: Well developed, well nourished, no acute distress Head: Normocephalic and atraumatic Eyes: Sclerae anicteric, EOMI Ears: Normal auditory acuity Mouth: Not examined, mask on during Covid-19 pandemic Lungs: Clear throughout to auscultation Heart: Regular rate and rhythm; no murmurs, rubs or bruits Abdomen: Soft, non tender and non distended. No masses, hepatosplenomegaly or hernias noted. Normal Bowel sounds Rectal: Not done Musculoskeletal: Symmetrical with no gross deformities  Pulses:  Normal pulses noted Extremities: No clubbing, cyanosis, edema or deformities noted Neurological: Alert oriented x 4, grossly nonfocal Psychological:  Alert and cooperative. Normal mood and affect   Assessment and Recommendations:  Compensated NASH cirrhosis with splenomegaly.  Mild thrombocytopenia.  Check PT/INR and AFP. RUQ [place] at 6 months from her last CT scan.  No more than 2 g of Tylenol daily.  Continue to follow a carb modified, fat modified, weight loss diet. REV in 6 months.

## 2021-06-30 NOTE — Patient Instructions (Signed)
Your provider has requested that you go to the basement level for lab work before leaving today. Press "B" on the elevator. The lab is located at the first door on the left as you exit the elevator.  Due to recent changes in healthcare laws, you may see the results of your imaging and laboratory studies on MyChart before your provider has had a chance to review them.  We understand that in some cases there may be results that are confusing or concerning to you. Not all laboratory results come back in the same time frame and the provider may be waiting for multiple results in order to interpret others.  Please give [place] 48 hours in order for your provider to thoroughly review all the results before contacting the office for clarification of your results.   The [facility] providers would like to encourage you to use [facility] to communicate with providers for non-urgent requests or questions.  Due to long hold times on the telephone, sending your provider a message by [facility] may be a faster and more efficient way to get a response.  Please allow 48 business hours for a response.  Please remember that this is for non-urgent requests.   Thank you for choosing me and [facility].  Pricilla Riffle. Dagoberto Ligas., MD., Marval Regal

## 2021-07-01 LAB — AFP TUMOR MARKER: AFP-Tumor Marker: 8.2 ng/mL — ABNORMAL HIGH

## 2021-07-02 ENCOUNTER — Other Ambulatory Visit: Payer: Self-pay

## 2021-07-02 DIAGNOSIS — K7460 Unspecified cirrhosis of liver: Secondary | ICD-10-CM

## 2021-12-25 ENCOUNTER — Encounter: Payer: Self-pay | Admitting: Gastroenterology

## 2021-12-30 ENCOUNTER — Telehealth: Payer: Self-pay

## 2021-12-30 NOTE — Telephone Encounter (Signed)
Unable to leave message for patient, vm box not set up.

## 2021-12-30 NOTE — Telephone Encounter (Signed)
-----   Message from Marlon Pel, RN sent at 07/02/2021 10:57 AM EST ----- Needs labs/ see results 07/01/21 [facility] Orders are in

## 2022-01-04 NOTE — Telephone Encounter (Signed)
Spoke with patient & reminded her to come in for lab work. She has been given instructions on when/where to go, and verbalized all understanding.

## 2022-01-07 ENCOUNTER — Other Ambulatory Visit: Payer: Self-pay

## 2022-01-07 ENCOUNTER — Other Ambulatory Visit (INDEPENDENT_AMBULATORY_CARE_PROVIDER_SITE_OTHER): Payer: Medicare HMO

## 2022-01-07 DIAGNOSIS — K7460 Unspecified cirrhosis of liver: Secondary | ICD-10-CM

## 2022-01-07 LAB — PROTIME-INR
INR: 1.2 ratio — ABNORMAL HIGH (ref 0.8–1.0)
Prothrombin Time: 12.9 s (ref 9.6–13.1)

## 2022-01-07 LAB — CBC WITH DIFFERENTIAL/PLATELET
Basophils Absolute: 0 10*3/uL (ref 0.0–0.1)
Basophils Relative: 0.9 % (ref 0.0–3.0)
Eosinophils Absolute: 0.1 10*3/uL (ref 0.0–0.7)
Eosinophils Relative: 1.5 % (ref 0.0–5.0)
HCT: 40.1 % (ref 36.0–46.0)
Hemoglobin: 13.5 g/dL (ref 12.0–15.0)
Lymphocytes Relative: 24.2 % (ref 12.0–46.0)
Lymphs Abs: 1.1 10*3/uL (ref 0.7–4.0)
MCHC: 33.8 g/dL (ref 30.0–36.0)
MCV: 92.9 fl (ref 78.0–100.0)
Monocytes Absolute: 0.4 10*3/uL (ref 0.1–1.0)
Monocytes Relative: 9 % (ref 3.0–12.0)
Neutro Abs: 2.9 10*3/uL (ref 1.4–7.7)
Neutrophils Relative %: 64.4 % (ref 43.0–77.0)
Platelets: 129 10*3/uL — ABNORMAL LOW (ref 150.0–400.0)
RBC: 4.31 Mil/uL (ref 3.87–5.11)
RDW: 13.6 % (ref 11.5–15.5)
WBC: 4.5 10*3/uL (ref 4.0–10.5)

## 2022-01-07 LAB — COMPREHENSIVE METABOLIC PANEL
ALT: 36 U/L — ABNORMAL HIGH (ref 0–35)
AST: 51 U/L — ABNORMAL HIGH (ref 0–37)
Albumin: 4 g/dL (ref 3.5–5.2)
Alkaline Phosphatase: 80 U/L (ref 39–117)
BUN: 20 mg/dL (ref 6–23)
CO2: 31 mEq/L (ref 19–32)
Calcium: 9.5 mg/dL (ref 8.4–10.5)
Chloride: 102 mEq/L (ref 96–112)
Creatinine, Ser: 0.77 mg/dL (ref 0.40–1.20)
GFR: 78.67 mL/min (ref >60.00)
Glucose, Bld: 144 mg/dL — ABNORMAL HIGH (ref 70–99)
Potassium: 3.6 mEq/L (ref 3.5–5.1)
Sodium: 138 mEq/L (ref 135–145)
Total Bilirubin: 1.4 mg/dL — ABNORMAL HIGH (ref 0.2–1.2)
Total Protein: 7.5 g/dL (ref 6.0–8.3)

## 2022-01-08 LAB — AFP TUMOR MARKER: AFP-Tumor Marker: 8.8 ng/mL — ABNORMAL HIGH

## 2022-01-15 ENCOUNTER — Ambulatory Visit (HOSPITAL_COMMUNITY)
Admission: RE | Admit: 2022-01-15 | Discharge: 2022-01-15 | Disposition: A | Discharge status: Home / Self Care | Payer: Medicare HMO | Source: Ambulatory Visit | Attending: Gastroenterology | Admitting: Gastroenterology

## 2022-01-15 DIAGNOSIS — K7460 Unspecified cirrhosis of liver: Secondary | ICD-10-CM | POA: Insufficient documentation

## 2022-01-18 ENCOUNTER — Other Ambulatory Visit: Payer: Self-pay

## 2022-01-18 DIAGNOSIS — K7460 Unspecified cirrhosis of liver: Secondary | ICD-10-CM

## 2022-01-18 DIAGNOSIS — R9389 Abnormal findings on diagnostic imaging of other specified body structures: Secondary | ICD-10-CM

## 2022-01-27 ENCOUNTER — Ambulatory Visit (HOSPITAL_COMMUNITY)
Admission: RE | Admit: 2022-01-27 | Discharge: 2022-01-27 | Disposition: A | Discharge status: Home / Self Care | Payer: Medicare HMO | Source: Ambulatory Visit | Attending: Gastroenterology | Admitting: Gastroenterology

## 2022-01-27 DIAGNOSIS — K7460 Unspecified cirrhosis of liver: Secondary | ICD-10-CM | POA: Insufficient documentation

## 2022-01-27 DIAGNOSIS — R9389 Abnormal findings on diagnostic imaging of other specified body structures: Secondary | ICD-10-CM | POA: Diagnosis present

## 2022-01-27 MED ORDER — GADOBUTROL 1 MMOL/ML IV SOLN
10.0000 mL | Freq: Once | INTRAVENOUS | Status: AC | PRN
  Administered 2022-01-27: 10 mL via INTRAVENOUS

## 2022-06-29 DIAGNOSIS — R079 Chest pain, unspecified: Secondary | ICD-10-CM | POA: Diagnosis not present

## 2023-03-30 ENCOUNTER — Ambulatory Visit: Payer: Medicare HMO | Admitting: Gastroenterology

## 2023-03-30 ENCOUNTER — Other Ambulatory Visit (INDEPENDENT_AMBULATORY_CARE_PROVIDER_SITE_OTHER): Payer: Medicare HMO

## 2023-03-30 ENCOUNTER — Encounter: Payer: Self-pay | Admitting: Gastroenterology

## 2023-03-30 VITALS — BP 122/68 | HR 70 | Ht 66.0 in | Wt 270.0 lb

## 2023-03-30 DIAGNOSIS — Z860101 Personal history of adenomatous and serrated colon polyps: Secondary | ICD-10-CM

## 2023-03-30 DIAGNOSIS — K7460 Unspecified cirrhosis of liver: Secondary | ICD-10-CM | POA: Diagnosis not present

## 2023-03-30 DIAGNOSIS — R079 Chest pain, unspecified: Secondary | ICD-10-CM

## 2023-03-30 LAB — COMPREHENSIVE METABOLIC PANEL
ALT: 37 U/L — ABNORMAL HIGH (ref 0–35)
AST: 48 U/L — ABNORMAL HIGH (ref 0–37)
Albumin: 4.1 g/dL (ref 3.5–5.2)
Alkaline Phosphatase: 87 U/L (ref 39–117)
BUN: 17 mg/dL (ref 6–23)
CO2: 29 meq/L (ref 19–32)
Calcium: 9.5 mg/dL (ref 8.4–10.5)
Chloride: 102 meq/L (ref 96–112)
Creatinine, Ser: 0.75 mg/dL (ref 0.40–1.20)
GFR: 80.5 mL/min (ref >60.00)
Glucose, Bld: 120 mg/dL — ABNORMAL HIGH (ref 70–99)
Potassium: 3.9 meq/L (ref 3.5–5.1)
Sodium: 141 meq/L (ref 135–145)
Total Bilirubin: 1 mg/dL (ref 0.2–1.2)
Total Protein: 7.7 g/dL (ref 6.0–8.3)

## 2023-03-30 LAB — CBC WITH DIFFERENTIAL/PLATELET
Basophils Absolute: 0 10*3/uL (ref 0.0–0.1)
Basophils Relative: 1 % (ref 0.0–3.0)
Eosinophils Absolute: 0.1 10*3/uL (ref 0.0–0.7)
Eosinophils Relative: 1.4 % (ref 0.0–5.0)
HCT: 41 % (ref 36.0–46.0)
Hemoglobin: 13.5 g/dL (ref 12.0–15.0)
Lymphocytes Relative: 21 % (ref 12.0–46.0)
Lymphs Abs: 1 10*3/uL (ref 0.7–4.0)
MCHC: 33 g/dL (ref 30.0–36.0)
MCV: 94.5 fL (ref 78.0–100.0)
Monocytes Absolute: 0.5 10*3/uL (ref 0.1–1.0)
Monocytes Relative: 9.6 % (ref 3.0–12.0)
Neutro Abs: 3.3 10*3/uL (ref 1.4–7.7)
Neutrophils Relative %: 67 % (ref 43.0–77.0)
Platelets: 136 10*3/uL — ABNORMAL LOW (ref 150.0–400.0)
RBC: 4.34 Mil/uL (ref 3.87–5.11)
RDW: 13.9 % (ref 11.5–15.5)
WBC: 5 10*3/uL (ref 4.0–10.5)

## 2023-03-30 LAB — PROTIME-INR
INR: 1.1 {ratio} — ABNORMAL HIGH (ref 0.8–1.0)
Prothrombin Time: 12.1 s (ref 9.6–13.1)

## 2023-03-30 MED ORDER — NA SULFATE-K SULFATE-MG SULF 17.5-3.13-1.6 GM/177ML PO SOLN
1.0000 | Freq: Once | ORAL | 0 refills | Status: AC
Start: 2023-03-30 — End: 2023-03-30

## 2023-03-30 NOTE — Progress Notes (Signed)
Assessment     Left lateral chest wall pain - suspected musculoskeletal pain  Compensated MASH cirrhosis with splenomegaly, thrombocytopenia Personal history of adenomatous colon polyps  History of diverticulitis Benign lesion associated with pancreatic tail - unchanged over years S/P lap hysterectomy, BSO for endometrial cancer 2018   Recommendations    Schedule colonoscopy for polyp surveillance and EGD to screen for varices.  Last colonoscopy complete to the ascending colon.  The risks (including bleeding, perforation, infection, missed lesions, medication reactions and possible hospitalization or surgery if complications occur), benefits, and alternatives to colonoscopy with possible biopsy and possible polypectomy were discussed with the patient and they consent to proceed.  The risks (including bleeding, perforation, infection, missed lesions, medication reactions and possible hospitalization or surgery if complications occur), benefits, and alternatives to endoscopy with possible biopsy and possible dilation were discussed with the patient and they consent to proceed.   CBC, CMP, PT/INR, AFP Schedule CT AP for HCC screening Tylenol no more than 2 g/day and follow-up with PCP for left sided chest pain   HPI    This is a 72 year old female returning for left-sided chest pain, cirrhosis and a history of adenomatous colon polyps.  She is accompanied by her husband.  She notes left sided chest pain for several weeks.  It is exacerbated by local pressure.  No change with meals or bowel movements.  She relates several episodes of diverticulitis treated with antibiotics by her PCP.  She is overdue for colonoscopy, EGD and HCC screening. Denies weight loss, abdominal pain, constipation, diarrhea, change in stool caliber, melena, hematochezia, nausea, vomiting, dysphagia, reflux symptoms.   Colonoscopy Sept 2019 - Incomplete colonoscopy to ascending colon.  - One 10 mm polyp in the  transverse colon, removed with a cold snare. Resected and retrieved.  - Moderate diverticulosis in the sigmoid colon, in the descending colon and in the transverse colon.  - The examination was otherwise normal on direct and retroflexion views. Path: TA  EGD Sept 2019 - Normal esophagus.  - Normal stomach.  - Normal examined duodenum.  - No specimens collected.   Labs / Imaging       Latest Ref Rng & Units 01/07/2022    9:37 AM 07/03/2019   11:38 AM 04/15/2019   12:25 PM  Hepatic Function  Total Protein 6.0 - 8.3 g/dL 7.5  7.7  7.4   Albumin 3.5 - 5.2 g/dL 4.0  4.1  3.5   AST 0 - 37 U/L 51  49  32   ALT 0 - 35 U/L 36  37  29   Alk Phosphatase 39 - 117 U/L 80  87  81   Total Bilirubin 0.2 - 1.2 mg/dL 1.4  1.1  2.8   Bilirubin, Direct 0.0 - 0.2 mg/dL   0.3        Latest Ref Rng & Units 01/07/2022    9:37 AM 03/06/2021   10:35 AM 07/03/2019   11:38 AM  CBC  WBC 4.0 - 10.5 K/uL 4.5  4.8  6.1   Hemoglobin 12.0 - 15.0 g/dL 25.3  66.4  40.3   Hematocrit 36.0 - 46.0 % 40.1  44.3  41.2   Platelets 150.0 - 400.0 K/uL 129.0  137  162.0      MR Abdomen W Wo Contrast CLINICAL DATA:  Further evaluation of possible hyperechoic mass in the right lobe of the liver.  EXAM: MRI ABDOMEN WITHOUT AND WITH CONTRAST  TECHNIQUE: Multiplanar multisequence MR  imaging of the abdomen was performed both before and after the administration of intravenous contrast.  CONTRAST:  10mL GADAVIST GADOBUTROL 1 MMOL/ML IV SOLN  COMPARISON:  CT March 06, 2021 and ultrasound right upper quadrant January 15, 2022  FINDINGS: Lower chest: No acute abnormality.  Hepatobiliary: Cirrhotic hepatic morphology. No suspicious hepatic lesion. Gallbladder surgically absent. No biliary ductal dilation.  Pancreas: No pancreatic ductal dilation or evidence of acute inflammation. No significant interval change in the soft tissue mass in the extending from the tail of the pancreas into the splenic hilum which  measures 4.1 x 2.7 cm on image 46/20 previously 4.0 x 3.1 cm, stable dating back to 2014. Also unchanged is the central cystic component measuring proximally 2 cm in maximum axial dimension. Once again the solid component of the lesion is similar in intrinsic signal intensity to spleen on all sequences with an enhancement pattern similar to spleen on all phases of acquired postcontrast imaging as well as similar restricted diffusion. Given long-term stability this is once again favored benign with broad differential considerations primarily including accessory splenule inguinal containing a benign lymphangioma or degenerated component versus primary pancreatic neoplasm including solid pseudopapillary tumor or degenerating neuroendocrine tumor.  Spleen:  Splenomegaly measuring 14.7 cm in maximum axial dimension.  Adrenals/Urinary Tract: Bilateral adrenal glands appear normal. No hydronephrosis. Fluid density 13 mm nonenhancing right lower pole renal lesion is consistent with a cyst and considered benign requiring no independent imaging follow-up.  Stomach/Bowel: Colonic diverticulosis without findings of acute diverticulitis. No pathologic dilation or evidence of acute inflammation involving loops of large or small bowel in the abdomen.  Vascular/Lymphatic: Normal caliber abdominal aorta. The portal, splenic and superior mesenteric veins are patent.  Other:  No abdominal free fluid.  Musculoskeletal: No suspicious osseous lesion.  IMPRESSION: 1. Cirrhotic hepatic morphology with sequela of portal hypertension including splenomegaly. 2. No suspicious hepatic lesion, specifically no lesion in the right lobe of the liver identified to correspond with findings on prior ultrasound. 3. Stable enhancing 4 cm soft tissue mass extending from the pancreatic tail into the splenic hilum not significantly changed dating back to 2014 and favored to reflect a benign etiology with broad  differential considerations with primary differential consideration an accessory splenule containing a lymphangioma or cystic degenerating component.  Electronically Signed   By: Maudry Mayhew M.D.   On: 01/27/2022 08:36   Current Medications, Allergies, Past Medical History, Past Surgical History, Family History and Social History were reviewed in [employer] record.   Physical Exam: General: Well developed, well nourished, no acute distress Head: Normocephalic and atraumatic Eyes: Sclerae anicteric, EOMI Ears: Normal auditory acuity Mouth: No deformities or lesions noted Lungs: Clear throughout to auscultation Chest wall: Tenderness to palpation lower left chest wall in the midaxillary line Heart: Regular rate and rhythm; No murmurs, rubs or bruits Abdomen: Soft, non tender and non distended. No masses, hepatosplenomegaly or hernias noted. Normal Bowel sounds Rectal: Deferred to colonoscopy  Musculoskeletal: Symmetrical with no gross deformities  Pulses:  Normal pulses noted Extremities: No edema or deformities noted Neurological: Alert oriented x 4, grossly nonfocal Psychological:  Alert and cooperative. Normal mood and affect   Cindia Hustead T. Russella Dar, MD 03/30/2023, 8:14 AM

## 2023-03-30 NOTE — Patient Instructions (Addendum)
Your provider has requested that you go to the basement level for lab work before leaving today. Press "B" on the elevator. The lab is located at the first door on the left as you exit the elevator.  You have been scheduled for an endoscopy and colonoscopy. Please follow the written instructions given to you at your visit today.  Please pick up your prep supplies at the pharmacy within the next 1-3 days.  If you use inhalers (even only as needed), please bring them with you on the day of your procedure.  DO NOT TAKE 7 DAYS PRIOR TO TEST- Trulicity (dulaglutide) Ozempic, Wegovy (semaglutide) Mounjaro (tirzepatide) Bydureon Bcise (exanatide extended release)  DO NOT TAKE 1 DAY PRIOR TO YOUR TEST Rybelsus (semaglutide) Adlyxin (lixisenatide) Victoza (liraglutide) Byetta (exanatide)   You have been scheduled for a CT scan of the abdomen and pelvis at [facility], 1st floor Radiology. You are scheduled on 04/05/23 at 3:00pm. Please arrive at 12:45pm.  Please follow the written instructions below on the day of your exam:   1) Do not eat anything after 11:00am (4 hours prior to your test)     You may take any medications as prescribed with a small amount of water, if necessary. If you take any of the following medications: METFORMIN, GLUCOPHAGE, GLUCOVANCE, AVANDAMET, RIOMET, FORTAMET, ACTOPLUS MET, JANUMET, GLUMETZA or METAGLIP, you MAY be asked to HOLD this medication 48 hours AFTER the exam.   The purpose of you drinking the oral contrast is to aid in the visualization of your intestinal tract. The contrast solution may cause some diarrhea. Depending on your individual set of symptoms, you may also receive an intravenous injection of x-ray contrast/dye. Plan on being at [facility] for 45 minutes or longer, depending on the type of exam you are having performed.   If you have any questions regarding your exam or if you need to reschedule, you may call [facility] at  [phone] between the hours of 8:00 am and 5:00 pm, Monday-Friday.   Due to recent changes in healthcare laws, you may see the results of your imaging and laboratory studies on MyChart before your provider has had a chance to review them.  We understand that in some cases there may be results that are confusing or concerning to you. Not all laboratory results come back in the same time frame and the provider may be waiting for multiple results in order to interpret others.  Please give [place] 48 hours in order for your provider to thoroughly review all the results before contacting the office for clarification of your results.   The [facility] providers would like to encourage you to use [facility] to communicate with providers for non-urgent requests or questions.  Due to long hold times on the telephone, sending your provider a message by [facility] may be a faster and more efficient way to get a response.  Please allow 48 business hours for a response.  Please remember that this is for non-urgent requests.   Thank you for choosing me and [facility].  Venita Lick. Pleas Koch., MD., Clementeen Graham

## 2023-03-31 ENCOUNTER — Telehealth: Payer: Self-pay | Admitting: Gastroenterology

## 2023-03-31 LAB — AFP TUMOR MARKER: AFP-Tumor Marker: 9.3 ng/mL — ABNORMAL HIGH

## 2023-03-31 MED ORDER — AMOXICILLIN-POT CLAVULANATE 875-125 MG PO TABS: 1.0000 | ORAL_TABLET | Freq: Two times a day (BID) | ORAL | 0 refills | Status: DC

## 2023-03-31 NOTE — Telephone Encounter (Signed)
PT is calling to get an antibiotic for diverticulitis flare. Please advise.

## 2023-03-31 NOTE — Telephone Encounter (Signed)
She states she has lower abd discomfort that is more to the left side.  She states this is the same she has felt with previous flares.  Afebrile, no bleeding, or other complaints. Last BM yesterday but it was a hard stool to pass.  She states " I know this is a flare and I need abx or it will get worse"

## 2023-03-31 NOTE — Telephone Encounter (Signed)
Prescription sent  She should call if symptoms do not resolve  No answer no voice mail

## 2023-03-31 NOTE — Telephone Encounter (Signed)
She did not indicate she was currently having a flare and I did not find any abdominal tenderness. She mentioned prior flares. What are her current symptoms?

## 2023-03-31 NOTE — Telephone Encounter (Signed)
The patient has been notified of this information and all questions answered.

## 2023-03-31 NOTE — Telephone Encounter (Signed)
The pt states that she was told at her visit yesterday by Dr Russella Dar that an antibiotic would be sent in for diverticulitis flare.  I do not see that  mentioned in the note please advise

## 2023-03-31 NOTE — Telephone Encounter (Signed)
Augmentin 875 mg po bid with food, #14, no refills

## 2023-04-05 ENCOUNTER — Ambulatory Visit (HOSPITAL_COMMUNITY)
Admission: RE | Admit: 2023-04-05 | Discharge: 2023-04-05 | Disposition: A | Discharge status: Home / Self Care | Payer: Medicare HMO | Source: Ambulatory Visit | Attending: Gastroenterology | Admitting: Gastroenterology

## 2023-04-05 ENCOUNTER — Encounter: Payer: Self-pay | Admitting: Gastroenterology

## 2023-04-05 DIAGNOSIS — R079 Chest pain, unspecified: Secondary | ICD-10-CM | POA: Diagnosis present

## 2023-04-05 DIAGNOSIS — K7460 Unspecified cirrhosis of liver: Secondary | ICD-10-CM | POA: Diagnosis present

## 2023-04-05 MED ORDER — IOHEXOL 9 MG/ML PO SOLN
1000.0000 mL | Freq: Once | ORAL | Status: AC
  Administered 2023-04-05: 1000 mL via ORAL

## 2023-04-05 MED ORDER — IOHEXOL 300 MG/ML  SOLN
100.0000 mL | Freq: Once | INTRAMUSCULAR | Status: AC | PRN
  Administered 2023-04-05: 100 mL via INTRAVENOUS

## 2023-04-14 ENCOUNTER — Encounter (HOSPITAL_COMMUNITY): Payer: Self-pay

## 2023-04-14 ENCOUNTER — Encounter: Payer: Self-pay | Admitting: Gastroenterology

## 2023-04-14 ENCOUNTER — Emergency Department (HOSPITAL_COMMUNITY): Payer: Medicare HMO

## 2023-04-14 ENCOUNTER — Other Ambulatory Visit: Payer: Self-pay

## 2023-04-14 ENCOUNTER — Ambulatory Visit: Payer: Medicare HMO | Admitting: Gastroenterology

## 2023-04-14 ENCOUNTER — Inpatient Hospital Stay (HOSPITAL_COMMUNITY)
Admission: EM | Admit: 2023-04-14 | Discharge: 2023-04-16 | DRG: 393 | Disposition: A | Discharge status: Home / Self Care | Payer: Medicare HMO | Source: Ambulatory Visit | Attending: Family Medicine | Admitting: Family Medicine

## 2023-04-14 VITALS — BP 126/67 | HR 80 | Temp 98.1°F | Resp 14 | Ht 66.0 in | Wt 270.0 lb

## 2023-04-14 DIAGNOSIS — K869 Disease of pancreas, unspecified: Secondary | ICD-10-CM | POA: Diagnosis not present

## 2023-04-14 DIAGNOSIS — Z860101 Personal history of adenomatous and serrated colon polyps: Secondary | ICD-10-CM

## 2023-04-14 DIAGNOSIS — N3000 Acute cystitis without hematuria: Secondary | ICD-10-CM | POA: Diagnosis present

## 2023-04-14 DIAGNOSIS — K319 Disease of stomach and duodenum, unspecified: Secondary | ICD-10-CM

## 2023-04-14 DIAGNOSIS — Z1624 Resistance to multiple antibiotics: Secondary | ICD-10-CM | POA: Diagnosis present

## 2023-04-14 DIAGNOSIS — K7581 Nonalcoholic steatohepatitis (NASH): Secondary | ICD-10-CM | POA: Diagnosis present

## 2023-04-14 DIAGNOSIS — Z831 Family history of other infectious and parasitic diseases: Secondary | ICD-10-CM

## 2023-04-14 DIAGNOSIS — E876 Hypokalemia: Secondary | ICD-10-CM | POA: Diagnosis present

## 2023-04-14 DIAGNOSIS — D121 Benign neoplasm of appendix: Secondary | ICD-10-CM | POA: Diagnosis not present

## 2023-04-14 DIAGNOSIS — Z90710 Acquired absence of both cervix and uterus: Secondary | ICD-10-CM | POA: Diagnosis not present

## 2023-04-14 DIAGNOSIS — Z8542 Personal history of malignant neoplasm of other parts of uterus: Secondary | ICD-10-CM

## 2023-04-14 DIAGNOSIS — D62 Acute posthemorrhagic anemia: Secondary | ICD-10-CM | POA: Diagnosis present

## 2023-04-14 DIAGNOSIS — E039 Hypothyroidism, unspecified: Secondary | ICD-10-CM | POA: Diagnosis present

## 2023-04-14 DIAGNOSIS — Z79890 Hormone replacement therapy (postmenopausal): Secondary | ICD-10-CM | POA: Diagnosis not present

## 2023-04-14 DIAGNOSIS — K635 Polyp of colon: Secondary | ICD-10-CM | POA: Diagnosis not present

## 2023-04-14 DIAGNOSIS — K7460 Unspecified cirrhosis of liver: Secondary | ICD-10-CM

## 2023-04-14 DIAGNOSIS — Z9889 Other specified postprocedural states: Secondary | ICD-10-CM | POA: Diagnosis not present

## 2023-04-14 DIAGNOSIS — Z9049 Acquired absence of other specified parts of digestive tract: Secondary | ICD-10-CM

## 2023-04-14 DIAGNOSIS — K9189 Other postprocedural complications and disorders of digestive system: Secondary | ICD-10-CM | POA: Diagnosis not present

## 2023-04-14 DIAGNOSIS — Z886 Allergy status to analgesic agent status: Secondary | ICD-10-CM

## 2023-04-14 DIAGNOSIS — Z8049 Family history of malignant neoplasm of other genital organs: Secondary | ICD-10-CM

## 2023-04-14 DIAGNOSIS — K5730 Diverticulosis of large intestine without perforation or abscess without bleeding: Secondary | ICD-10-CM | POA: Diagnosis present

## 2023-04-14 DIAGNOSIS — R109 Unspecified abdominal pain: Secondary | ICD-10-CM | POA: Diagnosis present

## 2023-04-14 DIAGNOSIS — R9431 Abnormal electrocardiogram [ECG] [EKG]: Secondary | ICD-10-CM | POA: Diagnosis present

## 2023-04-14 DIAGNOSIS — Z6841 Body Mass Index (BMI) 40.0 and over, adult: Secondary | ICD-10-CM | POA: Diagnosis not present

## 2023-04-14 DIAGNOSIS — K644 Residual hemorrhoidal skin tags: Secondary | ICD-10-CM | POA: Diagnosis present

## 2023-04-14 DIAGNOSIS — Z79899 Other long term (current) drug therapy: Secondary | ICD-10-CM

## 2023-04-14 DIAGNOSIS — K661 Hemoperitoneum: Principal | ICD-10-CM | POA: Diagnosis present

## 2023-04-14 DIAGNOSIS — D124 Benign neoplasm of descending colon: Secondary | ICD-10-CM

## 2023-04-14 DIAGNOSIS — D120 Benign neoplasm of cecum: Secondary | ICD-10-CM | POA: Diagnosis not present

## 2023-04-14 DIAGNOSIS — R933 Abnormal findings on diagnostic imaging of other parts of digestive tract: Secondary | ICD-10-CM | POA: Diagnosis not present

## 2023-04-14 DIAGNOSIS — Z8249 Family history of ischemic heart disease and other diseases of the circulatory system: Secondary | ICD-10-CM

## 2023-04-14 DIAGNOSIS — R1084 Generalized abdominal pain: Principal | ICD-10-CM

## 2023-04-14 DIAGNOSIS — Z09 Encounter for follow-up examination after completed treatment for conditions other than malignant neoplasm: Secondary | ICD-10-CM | POA: Diagnosis not present

## 2023-04-14 DIAGNOSIS — Z806 Family history of leukemia: Secondary | ICD-10-CM

## 2023-04-14 DIAGNOSIS — Z801 Family history of malignant neoplasm of trachea, bronchus and lung: Secondary | ICD-10-CM

## 2023-04-14 DIAGNOSIS — Z8379 Family history of other diseases of the digestive system: Secondary | ICD-10-CM

## 2023-04-14 DIAGNOSIS — B9689 Other specified bacterial agents as the cause of diseases classified elsewhere: Secondary | ICD-10-CM | POA: Diagnosis present

## 2023-04-14 DIAGNOSIS — E66813 Obesity, class 3: Secondary | ICD-10-CM | POA: Diagnosis present

## 2023-04-14 DIAGNOSIS — Z808 Family history of malignant neoplasm of other organs or systems: Secondary | ICD-10-CM

## 2023-04-14 DIAGNOSIS — K766 Portal hypertension: Secondary | ICD-10-CM | POA: Diagnosis present

## 2023-04-14 DIAGNOSIS — R112 Nausea with vomiting, unspecified: Secondary | ICD-10-CM

## 2023-04-14 DIAGNOSIS — K862 Cyst of pancreas: Secondary | ICD-10-CM | POA: Diagnosis present

## 2023-04-14 DIAGNOSIS — I10 Essential (primary) hypertension: Secondary | ICD-10-CM | POA: Diagnosis present

## 2023-04-14 DIAGNOSIS — Z800 Family history of malignant neoplasm of digestive organs: Secondary | ICD-10-CM

## 2023-04-14 DIAGNOSIS — Z885 Allergy status to narcotic agent status: Secondary | ICD-10-CM

## 2023-04-14 LAB — PROTIME-INR
INR: 1.1 (ref 0.8–1.2)
Prothrombin Time: 14.9 s (ref 11.4–15.2)

## 2023-04-14 LAB — COMPREHENSIVE METABOLIC PANEL
ALT: 40 U/L (ref 0–44)
AST: 59 U/L — ABNORMAL HIGH (ref 15–41)
Albumin: 3.7 g/dL (ref 3.5–5.0)
Alkaline Phosphatase: 74 U/L (ref 38–126)
Anion gap: 10 (ref 5–15)
BUN: 13 mg/dL (ref 8–23)
CO2: 25 mmol/L (ref 22–32)
Calcium: 8.8 mg/dL — ABNORMAL LOW (ref 8.9–10.3)
Chloride: 102 mmol/L (ref 98–111)
Creatinine, Ser: 0.43 mg/dL — ABNORMAL LOW (ref 0.44–1.00)
GFR, Estimated: >60 mL/min (ref >60)
Glucose, Bld: 159 mg/dL — ABNORMAL HIGH (ref 70–99)
Potassium: 3.1 mmol/L — ABNORMAL LOW (ref 3.5–5.1)
Sodium: 137 mmol/L (ref 135–145)
Total Bilirubin: 1.7 mg/dL — ABNORMAL HIGH (ref <1.2)
Total Protein: 7 g/dL (ref 6.5–8.1)

## 2023-04-14 LAB — TYPE AND SCREEN
ABO/RH(D): A POS
Antibody Screen: NEGATIVE

## 2023-04-14 LAB — CBC WITH DIFFERENTIAL/PLATELET
Abs Immature Granulocytes: 0.04 10*3/uL (ref 0.00–0.07)
Basophils Absolute: 0 10*3/uL (ref 0.0–0.1)
Basophils Relative: 0 %
Eosinophils Absolute: 0 10*3/uL (ref 0.0–0.5)
Eosinophils Relative: 1 %
HCT: 34.9 % — ABNORMAL LOW (ref 36.0–46.0)
Hemoglobin: 11.8 g/dL — ABNORMAL LOW (ref 12.0–15.0)
Immature Granulocytes: 1 %
Lymphocytes Relative: 14 %
Lymphs Abs: 1.1 10*3/uL (ref 0.7–4.0)
MCH: 32.1 pg (ref 26.0–34.0)
MCHC: 33.8 g/dL (ref 30.0–36.0)
MCV: 94.8 fL (ref 80.0–100.0)
Monocytes Absolute: 0.6 10*3/uL (ref 0.1–1.0)
Monocytes Relative: 7 %
Neutro Abs: 6.1 10*3/uL (ref 1.7–7.7)
Neutrophils Relative %: 77 %
Platelets: 143 10*3/uL — ABNORMAL LOW (ref 150–400)
RBC: 3.68 MIL/uL — ABNORMAL LOW (ref 3.87–5.11)
RDW: 13 % (ref 11.5–15.5)
WBC: 7.9 10*3/uL (ref 4.0–10.5)
nRBC: 0 % (ref 0.0–0.2)

## 2023-04-14 LAB — MAGNESIUM: Magnesium: 1.9 mg/dL (ref 1.7–2.4)

## 2023-04-14 LAB — LIPASE, BLOOD: Lipase: 66 U/L — ABNORMAL HIGH (ref 11–51)

## 2023-04-14 LAB — APTT: aPTT: 26 s (ref 24–36)

## 2023-04-14 LAB — ABO/RH: ABO/RH(D): A POS

## 2023-04-14 MED ORDER — ACETAMINOPHEN 325 MG PO TABS
650.0000 mg | ORAL_TABLET | Freq: Four times a day (QID) | ORAL | Status: DC | PRN
  Administered 2023-04-15 – 2023-04-16 (×2): 650 mg via ORAL
  Filled 2023-04-14 (×2): qty 2

## 2023-04-14 MED ORDER — SODIUM CHLORIDE 0.9 % IV SOLN: 500.0000 mL | Freq: Once | INTRAVENOUS | Status: DC

## 2023-04-14 MED ORDER — MORPHINE SULFATE (PF) 4 MG/ML IV SOLN
4.0000 mg | Freq: Once | INTRAVENOUS | Status: AC
  Administered 2023-04-14: 4 mg via INTRAVENOUS
  Filled 2023-04-14: qty 1

## 2023-04-14 MED ORDER — NALOXONE HCL 0.4 MG/ML IJ SOLN: 0.4000 mg | INTRAMUSCULAR | Status: DC | PRN

## 2023-04-14 MED ORDER — LORAZEPAM 2 MG/ML IJ SOLN
0.5000 mg | Freq: Four times a day (QID) | INTRAMUSCULAR | Status: DC | PRN
  Administered 2023-04-15: 0.5 mg via INTRAVENOUS
  Filled 2023-04-14: qty 1

## 2023-04-14 MED ORDER — POTASSIUM CHLORIDE 10 MEQ/100ML IV SOLN
10.0000 meq | INTRAVENOUS | Status: DC
  Administered 2023-04-14 (×2): 10 meq via INTRAVENOUS
  Filled 2023-04-14 (×2): qty 100

## 2023-04-14 MED ORDER — ONDANSETRON HCL 4 MG/2ML IJ SOLN
4.0000 mg | Freq: Once | INTRAMUSCULAR | Status: AC
  Administered 2023-04-14: 4 mg via INTRAVENOUS
  Filled 2023-04-14: qty 2

## 2023-04-14 MED ORDER — SODIUM CHLORIDE 0.9 % IV BOLUS
500.0000 mL | Freq: Once | INTRAVENOUS | Status: AC
  Administered 2023-04-14: 500 mL via INTRAVENOUS

## 2023-04-14 MED ORDER — IOHEXOL 300 MG/ML  SOLN
100.0000 mL | Freq: Once | INTRAMUSCULAR | Status: AC | PRN
  Administered 2023-04-14: 100 mL via INTRAVENOUS

## 2023-04-14 MED ORDER — MORPHINE SULFATE (PF) 4 MG/ML IV SOLN
4.0000 mg | INTRAVENOUS | Status: DC | PRN
  Administered 2023-04-14 – 2023-04-15 (×3): 4 mg via INTRAVENOUS
  Filled 2023-04-14 (×3): qty 1

## 2023-04-14 MED ORDER — METOCLOPRAMIDE HCL 5 MG/ML IJ SOLN
5.0000 mg | Freq: Once | INTRAMUSCULAR | Status: AC
  Administered 2023-04-14: 5 mg via INTRAVENOUS
  Filled 2023-04-14: qty 2

## 2023-04-14 MED ORDER — ACETAMINOPHEN 650 MG RE SUPP: 650.0000 mg | Freq: Four times a day (QID) | RECTAL | Status: DC | PRN

## 2023-04-14 MED ORDER — ONDANSETRON HCL 4 MG/2ML IJ SOLN: 4.0000 mg | Freq: Four times a day (QID) | INTRAMUSCULAR | Status: DC | PRN

## 2023-04-14 NOTE — Patient Instructions (Signed)
  Educational handout provided to patient related to Hemorrhoids, Polyps, and Diverticulosis  Resume previous diet  Continue present medications  Awaiting pathology resultsYOU HAD AN ENDOSCOPIC PROCEDURE TODAY AT THE [facility]:   Refer to the procedure report that was given to you for any specific questions about what was found during the examination.  If the procedure report does not answer your questions, please call your gastroenterologist to clarify.  If you requested that your care partner not be given the details of your procedure findings, then the procedure report has been included in a sealed envelope for you to review at your convenience later.  YOU SHOULD EXPECT: Some feelings of bloating in the abdomen. Passage of more gas than usual.  Walking can help get rid of the air that was put into your GI tract during the procedure and reduce the bloating. If you had a lower endoscopy (such as a colonoscopy or flexible sigmoidoscopy) you may notice spotting of blood in your stool or on the toilet paper. If you underwent a bowel prep for your procedure, you may not have a normal bowel movement for a few days.  Please Note:  You might notice some irritation and congestion in your nose or some drainage.  This is from the oxygen used during your procedure.  There is no need for concern and it should clear up in a day or so.  SYMPTOMS TO REPORT IMMEDIATELY:  Following lower endoscopy (colonoscopy or flexible sigmoidoscopy):  Excessive amounts of blood in the stool  Significant tenderness or worsening of abdominal pains  Swelling of the abdomen that is new, acute  Fever of 100F or higher  Following upper endoscopy (EGD)  Vomiting of blood or coffee ground material  New chest pain or pain under the shoulder blades  Painful or persistently difficult swallowing  New shortness of breath  Fever of 100F or higher  Black, tarry-looking stools  For urgent or emergent issues, a  gastroenterologist can be reached at any hour by calling [phone]. Do not use MyChart messaging for urgent concerns.    DIET:  We do recommend a small meal at first, but then you may proceed to your regular diet.  Drink plenty of fluids but you should avoid alcoholic beverages for 24 hours.  ACTIVITY:  You should plan to take it easy for the rest of today and you should NOT DRIVE or use heavy machinery until tomorrow (because of the sedation medicines used during the test).    FOLLOW UP: Our staff will call the number listed on your records the next business day following your procedure.  We will call around 7:15- 8:00 am to check on you and address any questions or concerns that you may have regarding the information given to you following your procedure. If we do not reach you, we will leave a message.     If any biopsies were taken you will be contacted by phone or by letter within the next 1-3 weeks.  Please call us at [phone] if you have not heard about the biopsies in 3 weeks.    SIGNATURES/CONFIDENTIALITY: You and/or your care partner have signed paperwork which will be entered into your electronic medical record.  These signatures attest to the fact that that the information above on your After Visit Summary has been reviewed and is understood.  Full responsibility of the confidentiality of this discharge information lies with you and/or your care-partner.

## 2023-04-14 NOTE — Progress Notes (Signed)
A/O x 3, gd SR's, VSS, report to RN

## 2023-04-14 NOTE — ED Triage Notes (Signed)
EMS reports from [facility] post colonoscopy today, c/o bilateral upper abdominal pain and nausea since procedure. Staff states Pt has and is still passing gas.   BP 123/57 HR 73 RR 16 Sp02 98 RA  22ga R forearm.

## 2023-04-14 NOTE — ED Provider Notes (Signed)
Emergency Department Provider Note   I have reviewed the triage vital signs and the nursing notes.   HISTORY  Chief Complaint Abdominal Pain and Nausea   HPI Claudia Weber is a 72 y.o. female with past history reviewed below presents to the emergency department with severe abdominal pain and vomiting after colonoscopy today.  She was seen by [facility] as an outpatient for colonoscopy.  The records of the procedure are not available but husband reports this was a routine test.  Afterwards, she had severe pain and nausea/vomiting that did not resolve with medications given in the postop setting and was transferred to the emergency department.  She continues to pass flatus.  She is having dry heaves and vomiting.  No chest pain.  Describes diffuse abdominal pain worse in the upper abdomen.    Past Medical History:  Diagnosis Date   Cat scratch fever    Diverticulosis    Elevated LFTs    Hepatic cirrhosis (HCC)    High blood pressure    Other specified disorders of thyroid    Uterine cancer (HCC)     Review of Systems  Constitutional: No fever/chills Cardiovascular: Denies chest pain. Respiratory: Denies shortness of breath. Gastrointestinal: Positive abdominal pain. Positive nausea and vomiting.  No diarrhea.  No constipation. Genitourinary: Negative for dysuria. Musculoskeletal: Negative for back pain. Skin: Negative for rash. Neurological: Negative for headaches.  ____________________________________________   PHYSICAL EXAM:  VITAL SIGNS: ED Triage Vitals  Encounter Vitals Group     BP 04/14/23 1607 126/65     Pulse Rate 04/14/23 1607 81     Resp 04/14/23 1607 16     Temp 04/14/23 1607 97.9 F (36.6 C)     Temp Source 04/14/23 1607 Oral     SpO2 04/14/23 1607 95 %   Constitutional: Alert and oriented. Patient laying on side and appears uncomfortable with dry heaving.  Eyes: Conjunctivae are normal.  Head: Atraumatic. Nose: No  congestion/rhinnorhea. Mouth/Throat: Mucous membranes are moist.   Neck: No stridor.   Cardiovascular: Normal rate, regular rhythm. Good peripheral circulation. Grossly normal heart sounds.   Respiratory: Normal respiratory effort.  No retractions. Lungs CTAB. Gastrointestinal: Soft with diffuse tenderness. Mild distention.  Musculoskeletal: No gross deformities of extremities. Neurologic:  Normal speech and language.  Skin:  Skin is warm, dry and intact. No rash noted.   ____________________________________________   LABS (all labs ordered are listed, but only abnormal results are displayed)  Labs Reviewed  COMPREHENSIVE METABOLIC PANEL - Abnormal; Notable for the following components:      Result Value   Potassium 3.1 (*)    Glucose, Bld 159 (*)    Creatinine, Ser 0.43 (*)    Calcium 8.8 (*)    AST 59 (*)    Total Bilirubin 1.7 (*)    All other components within normal limits  LIPASE, BLOOD - Abnormal; Notable for the following components:   Lipase 66 (*)    All other components within normal limits  CBC WITH DIFFERENTIAL/PLATELET - Abnormal; Notable for the following components:   RBC 3.68 (*)    Hemoglobin 11.8 (*)    HCT 34.9 (*)    Platelets 143 (*)    All other components within normal limits  PROTIME-INR  MAGNESIUM  APTT  CBC WITH DIFFERENTIAL/PLATELET  COMPREHENSIVE METABOLIC PANEL  MAGNESIUM  HEMOGLOBIN AND HEMATOCRIT, BLOOD  HEMOGLOBIN AND HEMATOCRIT, BLOOD  PROTIME-INR  TYPE AND SCREEN  ABO/RH   ____________________________________________  EKG   EKG  Interpretation Date/Time:  Thursday April 14 2023 16:57:58 EST Ventricular Rate:  65 PR Interval:  160 QRS Duration:  104 QT Interval:  491 QTC Calculation: 511 R Axis:   70  Text Interpretation: Sinus rhythm Borderline T wave abnormalities Prolonged QT interval Confirmed by Alona Bene [phone]) on 04/14/2023 5:05:22 PM        ____________________________________________  RADIOLOGY  CT  ABDOMEN PELVIS W CONTRAST  Addendum Date: 04/14/2023   ADDENDUM REPORT: 04/14/2023 21:16 ADDENDUM: These results were called by telephone at the time of interpretation on 04/14/2023 at 9:15 pm to provider Alvilda Mckenna , who verbally acknowledged these results. Electronically Signed   By: Minerva Fester M.D.   On: 04/14/2023 21:16   Result Date: 04/14/2023 CLINICAL DATA:  Post colonoscopy abdominal pain. Bilateral upper abdominal pain and nausea since the procedure. EXAM: CT ABDOMEN AND PELVIS WITH CONTRAST TECHNIQUE: Multidetector CT imaging of the abdomen and pelvis was performed using the standard protocol following bolus administration of intravenous contrast. RADIATION DOSE REDUCTION: This exam was performed according to the departmental dose-optimization program which includes automated exposure control, adjustment of the mA and/or kV according to patient size and/or use of iterative reconstruction technique. CONTRAST:  OMNIPAQUE IOHEXOL 300 MG/ML  SOLN COMPARISON:  CT abdomen and pelvis 04/05/2023 FINDINGS: Lower chest: No acute abnormality. Hepatobiliary: Hepatic steatosis and cirrhosis. Cholecystectomy. No biliary dilation. Pancreas/spleen: The enhancing lesion in the splenic hilum/tail of the pancreas areas seen previously is now more ill-defined and heterogenous. The central cystic component is no longer visualized. Exact measurement is difficult given silhouetting from adjacent hemorrhage but measures approximately 4.6 x 4.1 cm. There is moderate adjacent hyperdense fluid suspicious for hemorrhage. No evidence of active bleeding. The fluid tracks around the spleen and inferior to the greater curvature of the stomach. Additional fluid in the pelvis. No free intraperitoneal air. The pancreas and spleen are otherwise unremarkable. Adrenals/Urinary Tract: Normal adrenal glands. No urinary calculi or hydronephrosis. Bladder is unremarkable. Stomach/Bowel: Normal caliber large and small bowel.  Colonic diverticulosis without diverticulitis. No bowel wall thickening. Stomach is within normal limits. Vascular/Lymphatic: No significant vascular findings are present. No enlarged abdominal or pelvic lymph nodes. Reproductive: Hysterectomy. Other: No abdominal wall hernia. Musculoskeletal: No acute fracture. IMPRESSION: 1. Moderate hyperdense fluid in the upper abdomen suspicious for hemorrhage. The enhancing lesion in the splenic hilum/tail of the pancreas is now more ill-defined and heterogenous and is favored to represent the source of hemorrhage. 2. Hepatic steatosis and cirrhosis. 3. Colonic diverticulosis without diverticulitis. Electronically Signed: By: Minerva Fester M.D. On: 04/14/2023 21:13   DG Chest 2 View  Result Date: 04/14/2023 CLINICAL DATA:  Postprocedural pain. Colonoscopy today. Abdominal pain and nausea. EXAM: CHEST - 2 VIEW COMPARISON:  06/29/2022 FINDINGS: Stable cardiomediastinal silhouette. Aortic atherosclerotic calcification. Left basilar atelectasis/scarring. Otherwise no focal consolidation, pleural effusion, or pneumothorax. No displaced rib fractures. IMPRESSION: No acute cardiopulmonary disease. Electronically Signed   By: Minerva Fester M.D.   On: 04/14/2023 20:34    ____________________________________________   PROCEDURES  Procedure(s) performed:   Procedures  CRITICAL CARE Performed by: Maia Plan Total critical care time: 35 minutes Critical care time was exclusive of separately billable procedures and treating other patients. Critical care was necessary to treat or prevent imminent or life-threatening deterioration. Critical care was time spent personally by me on the following activities: development of treatment plan with patient and/or surrogate as well as nursing, discussions with consultants, evaluation of patient's response to treatment, examination of patient, obtaining  history from patient or surrogate, ordering and performing treatments and  interventions, ordering and review of laboratory studies, ordering and review of radiographic studies, pulse oximetry and re-evaluation of patient's condition.  Alona Bene, MD Emergency Medicine  ____________________________________________   INITIAL IMPRESSION / ASSESSMENT AND PLAN / ED COURSE  Pertinent labs & imaging results that were available during my care of the patient were reviewed by me and considered in my medical decision making (see chart for details).   This patient is Presenting for Evaluation of abdominal pain, which does require a range of treatment options, and is a complaint that involves a high risk of morbidity and mortality.  The Differential Diagnoses includes but is not exclusive to acute cholecystitis, intrathoracic causes for epigastric abdominal pain, gastritis, duodenitis, pancreatitis, small bowel or large bowel obstruction, abdominal aortic aneurysm, hernia, gastritis, etc.   Critical Interventions-    Medications  acetaminophen (TYLENOL) tablet 650 mg (has no administration in time range)    Or  acetaminophen (TYLENOL) suppository 650 mg (has no administration in time range)  naloxone (NARCAN) injection 0.4 mg (has no administration in time range)  morphine (PF) 4 MG/ML injection 4 mg (has no administration in time range)  LORazepam (ATIVAN) injection 0.5 mg (has no administration in time range)  potassium chloride 10 mEq in 100 mL IVPB (has no administration in time range)  sodium chloride 0.9 % bolus 500 mL (0 mLs Intravenous Stopped 04/14/23 1756)  morphine (PF) 4 MG/ML injection 4 mg (4 mg Intravenous Given 04/14/23 1641)  ondansetron (ZOFRAN) injection 4 mg (4 mg Intravenous Given 04/14/23 1642)  morphine (PF) 4 MG/ML injection 4 mg (4 mg Intravenous Given 04/14/23 1727)  iohexol (OMNIPAQUE) 300 MG/ML solution 100 mL (100 mLs Intravenous Contrast Given 04/14/23 1736)  morphine (PF) 4 MG/ML injection 4 mg (4 mg Intravenous Given 04/14/23 2008)   metoCLOPramide (REGLAN) injection 5 mg (5 mg Intravenous Given 04/14/23 2041)    Reassessment after intervention:  pain improved. No hypotension.    I did obtain Additional Historical Information from husband at bedside.  I decided to review pertinent External Data, and in summary [facility] colonoscopy today.   Clinical Laboratory Tests Ordered, included lipase mildly elevated. CBC without leukocytosis. Hb mildly down-trending at 11.8. No AKI.   Radiologic Tests Ordered, included CXR and CT abdomen/pelvis. I independently interpreted the images and agree with radiology interpretation.   Cardiac Monitor Tracing which shows NSR.    Social Determinants of Health Risk patient is a non-smoker.   Consult complete with Dr. Magnus Ivan with general surgery. Discussed case and CT findings. He will consult. No hypotension or active extravasation of contrast media on CT.  No plan for immediate surgery.  [facility] to be involved as well and plan to admit to medicine due to significant medical comorbidities.   Dr. Leonides Schanz with [facility]. She will have the AM team consult.  TRH. Plan for admit.   Medical Decision Making: Summary:  Patient presents emergency department for evaluation of diffuse abdominal pain with nausea and vomiting after colonoscopy.  She has diffuse tenderness on exam.  I do have some concern for either postprocedure complication versus abnormal reaction to sedation.  Will plan for CT imaging and reassess.  Reevaluation with update and discussion with patient and husband. Patient's pain is well controlled. No hypotension. Discussed the CT findings and plan for surgery consultation and admission.   Patient's presentation is most consistent with acute presentation with potential threat to life or bodily function.  Disposition: admit  ____________________________________________  FINAL CLINICAL IMPRESSION(S) / ED DIAGNOSES  Final diagnoses:  Generalized abdominal pain  Hemoperitoneum   Nausea and vomiting, unspecified vomiting type    Note:  This document was prepared using Dragon voice recognition software and may include unintentional dictation errors.  Alona Bene, MD, [facility] Emergency Medicine    Darionna Banke, Arlyss Repress, MD 04/14/23 2231

## 2023-04-14 NOTE — Progress Notes (Signed)
Pt's states no medical or surgical changes since previsit or office visit. 

## 2023-04-14 NOTE — Progress Notes (Signed)
See 03/30/2023 H&P no changes

## 2023-04-14 NOTE — Progress Notes (Signed)
Patient complaining of pain- Levsin 2 tablets given. Patient still reporting pain but passing gas. Patient given Simethicone, still passing gas but not relief in pain. Patient reports pain 8, 9  "on her rib and "around her stomach". Dr.Stark informed. Per Dr. Russella Dar to admit to ED.  Patient reporting pain getting worse. Pain in upper left and right quadrant. Report given to charge nurse to help transfer to ED.

## 2023-04-14 NOTE — H&P (Addendum)
History and Physical      Claudia Weber:[id number] DOB: May 03, 1951 DOA: 04/14/2023; DOS: 04/14/2023  PCP: Erskine Emery, NP  Patient coming from: home   I have personally briefly reviewed patient's old medical records in [facility]  Chief Complaint: Abdominal pain  HPI: Claudia Weber is a 72 y.o. female with medical history significant for cirrhosis, essential hypertension, acquired hypothyroidism, who is admitted to [facility] on 04/14/2023 with hemoperitoneum after presenting from home to [facility] ED complaining of abdominal pain.   The patient underwent outpatient EGD and colonoscopy earlier today with Dr. Russella Dar of [facility] gastroenterology.  Indications for these endoscopic evaluations were Veraseal screening in the context of a history of cirrhosis as well as follow-up for history of colonic polyps, respectively.  No reported intraoperative complications.  However, while recovering postprocedure, the patient developed acute sharp generalized abdominal discomfort, that has been persistent since onset, worse with palpation over the abdomen.  She is also noted some associated nausea resulting in an episode of nonbloody, nonbilious emesis.  No recent melena or hematochezia.  Denies any associated subjective fever, chills, rigors, or generalized myalgias.  Not on any blood thinners as an outpatient, including no aspirin.  No recent preceding trauma.  Denies any associated chest pain, shortness of breath, palpitations, diaphoresis, dizziness, presyncope, or syncope.  In the setting of her history of cirrhosis, most recent prior liver enzymes were checked on 03/30/2023 and were notable at that time for the following: Alkaline phosphatase 87, total bilirubin 1.0, AST 48, ALT 37.    ED Course:  Vital signs in the ED were notable for the following: Afebrile; heart rates in the 70s to 80s; systolic blood pressures in the 120s 150s; respiratory rate 16-18, oxygen saturation 95 to 90%  on room air.  Labs were notable for the following: CMP notable for the following: Sodium 137, potassium 3.1, BUN 13 compared to most recent prior BUN data point of 17 on 03/30/2023, creatinine 0.43, calcium adjusted for mild hypoalbuminemia noted to be 9.0, avidin 3.7, alkaline phosphatase 74, total bilirubin 1.7, AST 59, ALT 40.  Lipase 66.  CBC notable for will with cell count 7900, hemoglobin 11.8 associated Neuraceq/recurrent properties and nonelevated RDW, relative to most recent prior hemoglobin data point of 13.5 on 03/30/2023, platelet count 143 compared to 136 on 03/30/2023.  INR 1.1.  Type and screen ordered.  Per my interpretation, EKG in ED demonstrated the following: Sinus rhythm with heart rate 65, prolonged QTc of 511 ms, no evidence of T wave or ST changes, including evidence of ST elevation.  Imaging in the ED, per corresponding formal radiology read, was notable for the following: 2 view chest x-ray showed no evidence of acute cardiopulmonary process, including no evidence of injury, edema, effusion, or pneumothorax.  CTA abdomen/pelvis with contrast, in comparison to the CT abdomen/pelvis performed on 04/05/2023, showed moderate hyperdense fluid in the upper abdomen suggestive of hemorrhage, with the enhancing lesion in the splenic hilum/tail the pancreas now more ill-defined and heterogeneous, with his lesion  favored to represent the source of hemorrhage.  Today CT abdomen/pelvis showed no evidence of free air to suggest bowel perforation, or any evidence of active extravasation of dye.  EDP discussed patient's case with on-call general surgery, Dr. Magnus Ivan, who will formally consult and see the patient tonight.  At this time, general surgery does not feel that the patient will need to be taken to the OR urgently overnight.  Additionally, EDP discussed patient's  case with on-call [place] gastroenterology, Dr. Leonides Schanz, who will formally consult and see the patient in the morning.  While  in the ED, the following were administered: Morphine 4 mg IV x 3 doses, Reglan 5 mg IV x 1, Zofran 4 mg IV x 1, normal saline x 500 cc bolus.  Subsequently, the patient was admitted for further evaluation management of presenting hemoperitoneum, with presenting labs also notable for hypokalemia, and EKG showing evidence of QTc prolongation.    Review of Systems: As per HPI otherwise 10 point review of systems negative.   Past Medical History:  Diagnosis Date   Cat scratch fever    Diverticulosis    Elevated LFTs    Hepatic cirrhosis (HCC)    High blood pressure    Other specified disorders of thyroid    Uterine cancer [facility])     Past Surgical History:  Procedure Laterality Date   ABDOMINAL HYSTERECTOMY  11/2016   BLADDER SURGERY     polyp removed   Breast cyst removal     CHOLECYSTECTOMY     KNEE SURGERY Left    arthroscopic   LIVER BIOPSY     right knee surgery      Social History:  reports that she has never smoked. She has never used smokeless tobacco. She reports that she does not currently use alcohol. She reports that she does not use drugs.   Allergies  Allergen Reactions   Codeine Other (See Comments)    Makes pt sick   Nsaids Other (See Comments)    Liver problems     Family History  Problem Relation Age of Onset   Throat cancer Mother    Lung cancer Mother    Colitis Mother        resection   Uterine cancer Sister    Hepatitis Brother    Heart disease Maternal Grandmother    Heart disease Maternal Grandfather    Liver cancer Cousin    Leukemia Cousin    Lung cancer Cousin    Colon cancer Cousin    Stomach cancer Neg Hx    Pancreatic cancer Neg Hx    Esophageal cancer Neg Hx    Rectal cancer Neg Hx     Family history reviewed and not pertinent    Prior to Admission medications   Medication Sig Start Date End Date Taking? Authorizing Provider  Cyanocobalamin (VITAMIN B-12) 2000 MCG TBCR Take 2,000 mcg by mouth daily.    [provider]  hydrochlorothiazide (MICROZIDE) 12.5 MG capsule Take 12.5 mg by mouth daily.    [provider]  levothyroxine (SYNTHROID, LEVOTHROID) 50 MCG tablet Take 50 mcg by mouth at bedtime.     [provider]  lisinopril (ZESTRIL) 40 MG tablet Take 40 mg by mouth daily.    [provider]  magnesium oxide (MAG-OX) 400 (241.3 Mg) MG tablet Take 400 mg by mouth at bedtime.  01/04/19   [provider]  meclizine (ANTIVERT) 25 MG tablet Take 12.5 mg by mouth 2 (two) times daily as needed. 08/04/22   [provider]  Omega-3 1000 MG CAPS Take 1 g by mouth at bedtime.     [provider]  VITAMIN D PO Take 1 tablet by mouth at bedtime.    [provider]     Objective    Physical Exam: Vitals:   04/14/23 1607 04/14/23 1755 04/14/23 2010 04/14/23 2145  BP: 126/65 129/64 (!) 151/82 122/68  Pulse: 81 72 83  74  Resp: 16 16 18 17   Temp: 97.9 F (36.6 C)  (!) 97.5 F (36.4 C)   TempSrc: Oral  Oral   SpO2: 95% 96% 98% 97%    General: appears to be stated age; alert, oriented Skin: warm, dry, no rash Head:  AT/[place] Mouth:  Oral mucosa membranes appear dry, normal dentition Neck: supple; trachea midline Heart:  RRR; did not appreciate any M/R/G Lungs: CTAB, did not appreciate any wheezes, rales, or rhonchi Abdomen: + BS; soft, ND, generalized tenderness to palpation in the absence of guarding, rigidity, or rebound tenderness Vascular: 2+ pedal pulses b/l; 2+ radial pulses b/l Extremities: no peripheral edema, no muscle wasting Neuro: strength and sensation intact in upper and lower extremities b/l    Labs on Admission: I have personally reviewed following labs and imaging studies  CBC: Recent Labs  Lab 04/14/23 1638  WBC 7.9  NEUTROABS 6.1  HGB 11.8*  HCT 34.9*  MCV 94.8  PLT 143*   Basic Metabolic Panel: Recent Labs  Lab 04/14/23 1638  NA 137  K 3.1*  CL 102  CO2 25  GLUCOSE 159*  BUN 13  CREATININE 0.43*   CALCIUM 8.8*   GFR: Estimated Creatinine Clearance: 87.4 mL/min (A) (by C-G formula based on SCr of 0.43 mg/dL (L)). Liver Function Tests: Recent Labs  Lab 04/14/23 1638  AST 59*  ALT 40  ALKPHOS 74  BILITOT 1.7*  PROT 7.0  ALBUMIN 3.7   Recent Labs  Lab 04/14/23 1638  LIPASE 66*   No results for input(s): "AMMONIA" in the last 168 hours. Coagulation Profile: Recent Labs  Lab 04/14/23 2129  INR 1.1   Cardiac Enzymes: No results for input(s): "CKTOTAL", "CKMB", "CKMBINDEX", "TROPONINI" in the last 168 hours. BNP (last 3 results) No results for input(s): "PROBNP" in the last 8760 hours. HbA1C: No results for input(s): "HGBA1C" in the last 72 hours. CBG: No results for input(s): "GLUCAP" in the last 168 hours. Lipid Profile: No results for input(s): "CHOL", "HDL", "LDLCALC", "TRIG", "CHOLHDL", "LDLDIRECT" in the last 72 hours. Thyroid Function Tests: No results for input(s): "TSH", "T4TOTAL", "FREET4", "T3FREE", "THYROIDAB" in the last 72 hours. Anemia Panel: No results for input(s): "VITAMINB12", "FOLATE", "FERRITIN", "TIBC", "IRON", "RETICCTPCT" in the last 72 hours. Urine analysis:    Component Value Date/Time   COLORURINE YELLOW 04/15/2019 1524   APPEARANCEUR CLEAR 04/15/2019 1524   LABSPEC 1.011 04/15/2019 1524   PHURINE 6.0 04/15/2019 1524   GLUCOSEU NEGATIVE 04/15/2019 1524   HGBUR NEGATIVE 04/15/2019 1524   BILIRUBINUR NEGATIVE 04/15/2019 1524   KETONESUR NEGATIVE 04/15/2019 1524   PROTEINUR NEGATIVE 04/15/2019 1524   NITRITE NEGATIVE 04/15/2019 1524   LEUKOCYTESUR NEGATIVE 04/15/2019 1524    Radiological Exams on Admission: CT ABDOMEN PELVIS W CONTRAST  Addendum Date: 04/14/2023   ADDENDUM REPORT: 04/14/2023 21:16 ADDENDUM: These results were called by telephone at the time of interpretation on 04/14/2023 at 9:15 pm to provider JOSHUA LONG , who verbally acknowledged these results. Electronically Signed   By: Minerva Fester M.D.   On: 04/14/2023  21:16   Result Date: 04/14/2023 CLINICAL DATA:  Post colonoscopy abdominal pain. Bilateral upper abdominal pain and nausea since the procedure. EXAM: CT ABDOMEN AND PELVIS WITH CONTRAST TECHNIQUE: Multidetector CT imaging of the abdomen and pelvis was performed using the standard protocol following bolus administration of intravenous contrast. RADIATION DOSE REDUCTION: This exam was performed according to the departmental dose-optimization program which includes automated exposure control, adjustment of the mA and/or kV according  to patient size and/or use of iterative reconstruction technique. CONTRAST:  OMNIPAQUE IOHEXOL 300 MG/ML  SOLN COMPARISON:  CT abdomen and pelvis 04/05/2023 FINDINGS: Lower chest: No acute abnormality. Hepatobiliary: Hepatic steatosis and cirrhosis. Cholecystectomy. No biliary dilation. Pancreas/spleen: The enhancing lesion in the splenic hilum/tail of the pancreas areas seen previously is now more ill-defined and heterogenous. The central cystic component is no longer visualized. Exact measurement is difficult given silhouetting from adjacent hemorrhage but measures approximately 4.6 x 4.1 cm. There is moderate adjacent hyperdense fluid suspicious for hemorrhage. No evidence of active bleeding. The fluid tracks around the spleen and inferior to the greater curvature of the stomach. Additional fluid in the pelvis. No free intraperitoneal air. The pancreas and spleen are otherwise unremarkable. Adrenals/Urinary Tract: Normal adrenal glands. No urinary calculi or hydronephrosis. Bladder is unremarkable. Stomach/Bowel: Normal caliber large and small bowel. Colonic diverticulosis without diverticulitis. No bowel wall thickening. Stomach is within normal limits. Vascular/Lymphatic: No significant vascular findings are present. No enlarged abdominal or pelvic lymph nodes. Reproductive: Hysterectomy. Other: No abdominal wall hernia. Musculoskeletal: No acute fracture. IMPRESSION: 1.  Moderate hyperdense fluid in the upper abdomen suspicious for hemorrhage. The enhancing lesion in the splenic hilum/tail of the pancreas is now more ill-defined and heterogenous and is favored to represent the source of hemorrhage. 2. Hepatic steatosis and cirrhosis. 3. Colonic diverticulosis without diverticulitis. Electronically Signed: By: Minerva Fester M.D. On: 04/14/2023 21:13   DG Chest 2 View  Result Date: 04/14/2023 CLINICAL DATA:  Postprocedural pain. Colonoscopy today. Abdominal pain and nausea. EXAM: CHEST - 2 VIEW COMPARISON:  06/29/2022 FINDINGS: Stable cardiomediastinal silhouette. Aortic atherosclerotic calcification. Left basilar atelectasis/scarring. Otherwise no focal consolidation, pleural effusion, or pneumothorax. No displaced rib fractures. IMPRESSION: No acute cardiopulmonary disease. Electronically Signed   By: Minerva Fester M.D.   On: 04/14/2023 20:34      Assessment/Plan    Principal Problem:   Hemoperitoneum Active Problems:   Cirrhosis (HCC)   Hypokalemia   Abdominal pain   Prolonged QT interval   Essential hypertension   Acquired hypothyroidism    #) Hemoperitoneum: In the context of acute abdominal discomfort, today's CT abdomen/pelvis with contrast, relative to CT abdomen/pelvis with contrast on 04/05/2023 shows interval development of findings suggestive of hemoperitoneum, suspected to be from lesion associated with the splenic hilum/tail of the pancreas, as further detailed above, without evidence of free air to suggest bowel perforation, and without evidence of active extravasation of contrast dye to suggest active bleeding.   Aside from her abdominal discomfort and intermittent nausea, she is otherwise asymptomatic.  Appears hemodynamically stable.  Slight interval drop in hemoglobin compared to corresponding value checked on 03/30/2023, as further quantified above.  No evidence of acute peritoneal findings on exam.  Of note, not on any blood thinners  as an outpatient.  However, potential complicating factors include her underlying history of cirrhosis INR 1.1.  EDP discussed patient's case with on-call general surgery, Dr. Magnus Ivan, who will formally consult and see the patient tonight.  At this time, general surgery does not feel that the patient will need to be taken to the OR urgently overnight.  Additionally, EDP discussed patient's case with on-call [place] gastroenterology, Dr. Leonides Schanz, who will formally consult and see the patient in the morning.  Plan: N.p.o..  General surgery and  gastroenterology to formally consult, as further detailed above.  Prn IV morphine for abdominal discomfort.  In setting of her QTc prolongation, will pursue prn IV Ativan for additional nausea.  Check PTT, repeat INR in the morning.  Type and screen has been ordered.  Acute 4-hour H&H's through 9 AM tomorrow morning.  CBC in the morning.  Refraining from pharmacologic DVT prophylaxis.  SCDs.                #) Hypokalemia: presenting potassium level noted to be 3.1.  In the setting of current n.p.o. status will pursue associated supplementation via IV route.  Plan: monitor on tele. KCl 40 meq IV over 4 hours. Add-on serum mag level. CMP, mag level in the AM.                    #) QTc prolongation: Presenting EKG demonstrates QTc of  511 ms. no overt outpatient medications that may be contributing to QTc prolongation.     Plan: Monitor on telemetry.  Add-on serum magnesium level.  Repeat EKG in the morning to monitor interval degree of QTc prolongation.  Change as needed IV Zofran to as needed IV Ativan for nausea/vomiting.                 #) Cirrhosis associated with NASH: Documented history of such.  Does not appear to be complicated by portal hypertension.  Not on Lasix or spironolactone as an outpatient.  Follows with [place] gastroenterology, and noted to have undergone outpatient EGD earlier today for  variceal screening, as further detailed above.  Presenting MELD score noted to be 10, associated with a 6% 12-month mortality.  Plan: Repeat CMP, INR in the morning.  Monitor strict I's and O's and daily weights.                   #) Essential Hypertension: documented h/o such, with outpatient antihypertensive regimen including lisinopril, HCTZ.  SBP's in the ED today: 120s to 150s mmHg. however, in the setting of her presenting hemoperitoneum and current n.p.o. status, will hold her outpatient hypertensive medications for now.  Plan: Close monitoring of subsequent BP via routine VS. hold outpatient lisinopril, HCTZ for now, as above.                  #) acquired hypothyroidism: documented h/o such, on Synthroid as outpatient.   Plan: Hold home Synthroid in the setting of current n.p.o. status.        DVT prophylaxis: SCD's   Code Status: Full code Family Communication: son, present at bedside Disposition Plan: Per Rounding Team Consults called: EDP discussed patient's case with on-call general surgery, Dr. Magnus Ivan, who will formally consult and see the patient tonight; Additionally, EDP discussed patient's case with on-call [place] gastroenterology, Dr. Leonides Schanz, who will formally consult and see the patient in the morning;  Admission status: Inpatient   I SPENT GREATER THAN 75  MINUTES IN CLINICAL CARE TIME/MEDICAL DECISION-MAKING IN COMPLETING THIS ADMISSION.     Chaney Born Wendall Isabell DO [facility] From 7PM - 7AM   04/14/2023, 10:02 PM

## 2023-04-14 NOTE — ED Notes (Signed)
10:03 PM  Report received from previous RN. This RN assumes care of the patient. Hospitalist present at bedside.

## 2023-04-14 NOTE — ED Notes (Addendum)
Attempted to ambulate pt, she became nauseous and threw up. Will notify provider

## 2023-04-14 NOTE — Consult Note (Signed)
Reason for Consult: Hemoperitoneum Referring Physician: Dr. Myra Gianotti  Claudia Weber is an 72 y.o. female.  HPI: This is a 72 year old female who underwent a upper and lower endoscopy earlier today.  She has a known history of cirrhosis and splenomegaly.  After the procedure she was having significant abdominal pain and had some emesis so she was sent to the emergency department.  She had a CT scan done this evening which was more than 8 hours after the colonoscopy and this showed a moderate amount of fluid in the upper abdomen suspected to be hemorrhage.  There was no active extravasation of contrast.  The exact source of the hemorrhage and fluid was uncertain but was suspicious for coming from a known likely benign cystic mass on the tail of her pancreas She has been hemodynamically stable for at least the last 8 to 9 hours.  She reports most of her pain is sharp and in the left upper quadrant.  She is no longer having emesis.  Past Medical History:  Diagnosis Date  . Cat scratch fever   . Diverticulosis   . Elevated LFTs   . Hepatic cirrhosis (HCC)   . High blood pressure   . Other specified disorders of thyroid   . Uterine cancer [facility])     Past Surgical History:  Procedure Laterality Date  . ABDOMINAL HYSTERECTOMY  11/2016  . BLADDER SURGERY     polyp removed  . Breast cyst removal    . CHOLECYSTECTOMY    . KNEE SURGERY Left    arthroscopic  . LIVER BIOPSY    . right knee surgery      Family History  Problem Relation Age of Onset  . Throat cancer Mother   . Lung cancer Mother   . Colitis Mother        resection  . Uterine cancer Sister   . Hepatitis Brother   . Heart disease Maternal Grandmother   . Heart disease Maternal Grandfather   . Liver cancer Cousin   . Leukemia Cousin   . Lung cancer Cousin   . Colon cancer Cousin   . Stomach cancer Neg Hx   . Pancreatic cancer Neg Hx   . Esophageal cancer Neg Hx   . Rectal cancer Neg Hx     Social History:  reports  that she has never smoked. She has never used smokeless tobacco. She reports that she does not currently use alcohol. She reports that she does not use drugs.  Allergies:  Allergies  Allergen Reactions  . Codeine Other (See Comments)    Makes pt sick  . Nsaids Other (See Comments)    Liver problems     Medications: I have reviewed the patient's current medications.  Results for orders placed or performed during the hospital encounter of 04/14/23 (from the past 48 hour(s))  Comprehensive metabolic panel     Status: Abnormal   Collection Time: 04/14/23  4:38 PM  Result Value Ref Range   Sodium 137 135 - 145 mmol/L   Potassium 3.1 (L) 3.5 - 5.1 mmol/L   Chloride 102 98 - 111 mmol/L   CO2 25 22 - 32 mmol/L   Glucose, Bld 159 (H) 70 - 99 mg/dL    Comment: Glucose reference range applies only to samples taken after fasting for at least 8 hours.   BUN 13 8 - 23 mg/dL   Creatinine, Ser 8.29 (L) 0.44 - 1.00 mg/dL   Calcium 8.8 (L) 8.9 - 10.3 mg/dL  Total Protein 7.0 6.5 - 8.1 g/dL   Albumin 3.7 3.5 - 5.0 g/dL   AST 59 (H) 15 - 41 U/L   ALT 40 0 - 44 U/L   Alkaline Phosphatase 74 38 - 126 U/L   Total Bilirubin 1.7 (H) <1.2 mg/dL   GFR, Estimated >78 >29 mL/min    Comment: (NOTE) Calculated using the CKD-EPI Creatinine Equation (2021)    Anion gap 10 5 - 15    Comment: Performed at [facility], [address] [place], [place] [address]  Lipase, blood     Status: Abnormal   Collection Time: 04/14/23  4:38 PM  Result Value Ref Range   Lipase 66 (H) 11 - 51 U/L    Comment: Performed at [facility], [address], [place], [place] [address]  CBC with Differential     Status: Abnormal   Collection Time: 04/14/23  4:38 PM  Result Value Ref Range   WBC 7.9 4.0 - 10.5 K/uL   RBC 3.68 (L) 3.87 - 5.11 MIL/uL   Hemoglobin 11.8 (L) 12.0 - 15.0 g/dL   HCT 84.6 (L) 96.2 - 95.2 %   MCV 94.8 80.0 - 100.0 fL   MCH 32.1 26.0 - 34.0 pg   MCHC 33.8 30.0  - 36.0 g/dL   RDW 84.1 32.4 - 40.1 %   Platelets 143 (L) 150 - 400 K/uL   nRBC 0.0 0.0 - 0.2 %   Neutrophils Relative % 77 %   Neutro Abs 6.1 1.7 - 7.7 K/uL   Lymphocytes Relative 14 %   Lymphs Abs 1.1 0.7 - 4.0 K/uL   Monocytes Relative 7 %   Monocytes Absolute 0.6 0.1 - 1.0 K/uL   Eosinophils Relative 1 %   Eosinophils Absolute 0.0 0.0 - 0.5 K/uL   Basophils Relative 0 %   Basophils Absolute 0.0 0.0 - 0.1 K/uL   Immature Granulocytes 1 %   Abs Immature Granulocytes 0.04 0.00 - 0.07 K/uL    Comment: Performed at [facility], [address], [place], [place] [address]  Protime-INR     Status: None   Collection Time: 04/14/23  9:29 PM  Result Value Ref Range   Prothrombin Time 14.9 11.4 - 15.2 seconds   INR 1.1 0.8 - 1.2    Comment: (NOTE) INR goal varies based on device and disease states. Performed at [facility], [address] [place], [place] [address]   Type and screen [facility]     Status: None (Preliminary result)   Collection Time: 04/14/23  9:29 PM  Result Value Ref Range   ABO/RH(D) PENDING    Antibody Screen PENDING    Sample Expiration      04/17/2023,2359 Performed at [facility], [address] [place], [place] [address]     CT ABDOMEN PELVIS W CONTRAST  Addendum Date: 04/14/2023   ADDENDUM REPORT: 04/14/2023 21:16 ADDENDUM: These results were called by telephone at the time of interpretation on 04/14/2023 at 9:15 pm to provider JOSHUA LONG , who verbally acknowledged these results. Electronically Signed   By: Minerva Fester M.D.   On: 04/14/2023 21:16   Result Date: 04/14/2023 CLINICAL DATA:  Post colonoscopy abdominal pain. Bilateral upper abdominal pain and nausea since the procedure. EXAM: CT ABDOMEN AND PELVIS WITH CONTRAST TECHNIQUE: Multidetector CT imaging of the abdomen and pelvis was performed using the standard protocol following bolus administration of intravenous  contrast. RADIATION DOSE REDUCTION:  This exam was performed according to the departmental dose-optimization program which includes automated exposure control, adjustment of the mA and/or kV according to patient size and/or use of iterative reconstruction technique. CONTRAST:  OMNIPAQUE IOHEXOL 300 MG/ML  SOLN COMPARISON:  CT abdomen and pelvis 04/05/2023 FINDINGS: Lower chest: No acute abnormality. Hepatobiliary: Hepatic steatosis and cirrhosis. Cholecystectomy. No biliary dilation. Pancreas/spleen: The enhancing lesion in the splenic hilum/tail of the pancreas areas seen previously is now more ill-defined and heterogenous. The central cystic component is no longer visualized. Exact measurement is difficult given silhouetting from adjacent hemorrhage but measures approximately 4.6 x 4.1 cm. There is moderate adjacent hyperdense fluid suspicious for hemorrhage. No evidence of active bleeding. The fluid tracks around the spleen and inferior to the greater curvature of the stomach. Additional fluid in the pelvis. No free intraperitoneal air. The pancreas and spleen are otherwise unremarkable. Adrenals/Urinary Tract: Normal adrenal glands. No urinary calculi or hydronephrosis. Bladder is unremarkable. Stomach/Bowel: Normal caliber large and small bowel. Colonic diverticulosis without diverticulitis. No bowel wall thickening. Stomach is within normal limits. Vascular/Lymphatic: No significant vascular findings are present. No enlarged abdominal or pelvic lymph nodes. Reproductive: Hysterectomy. Other: No abdominal wall hernia. Musculoskeletal: No acute fracture. IMPRESSION: 1. Moderate hyperdense fluid in the upper abdomen suspicious for hemorrhage. The enhancing lesion in the splenic hilum/tail of the pancreas is now more ill-defined and heterogenous and is favored to represent the source of hemorrhage. 2. Hepatic steatosis and cirrhosis. 3. Colonic diverticulosis without diverticulitis. Electronically Signed: By:  Minerva Fester M.D. On: 04/14/2023 21:13   DG Chest 2 View  Result Date: 04/14/2023 CLINICAL DATA:  Postprocedural pain. Colonoscopy today. Abdominal pain and nausea. EXAM: CHEST - 2 VIEW COMPARISON:  06/29/2022 FINDINGS: Stable cardiomediastinal silhouette. Aortic atherosclerotic calcification. Left basilar atelectasis/scarring. Otherwise no focal consolidation, pleural effusion, or pneumothorax. No displaced rib fractures. IMPRESSION: No acute cardiopulmonary disease. Electronically Signed   By: Minerva Fester M.D.   On: 04/14/2023 20:34    Review of Systems  All other systems reviewed and are negative.  Blood pressure 122/68, pulse 74, temperature (!) 97.5 F (36.4 C), temperature source Oral, resp. rate 17, SpO2 97%. Physical Exam Constitutional:      General: She is not in acute distress.    Appearance: She is obese. She is not ill-appearing or diaphoretic.  HENT:     Head: Normocephalic and atraumatic.  Eyes:     General: No scleral icterus. Cardiovascular:     Rate and Rhythm: Normal rate and regular rhythm.  Pulmonary:     Effort: Pulmonary effort is normal. No respiratory distress.     Breath sounds: Normal breath sounds.  Abdominal:     Comments: Her abdomen is obese.  It is soft and nondistended.  There is some tenderness with guarding in the left upper quadrant.  There is no frank peritonitis  Skin:    General: Skin is warm and dry.  Neurological:     General: No focal deficit present.     Mental Status: She is alert.  Psychiatric:        Mood and Affect: Mood normal.        Behavior: Behavior normal.    Assessment/Plan: Hemoperitoneum status post colonoscopy and upper endoscopy  I have reviewed the CT scan of the abdomen and pelvis showing the fluid.  It is a mild to moderate amount.  There is no active extravasation of contrast.  Radiology feels it could be from the cystic mass on the pancreas  which is less well-defined secondary to the fluid and may have  partly ruptured.  There is no obvious injury to the liver or the spleen.  There is also no free air. Again, she has been hemodynamically stable for at least the last 8 hours with normal vitals.  Her hemoglobin is 11.8 down from 13.5 that was performed 2 weeks ago. I do not believe she is actively bleeding.  There is nothing also that would indicate a bowel injury.  I suspect this was from the pancreatic cystic mass or blood vessels in the area with distention of the colon during the colonoscopy.  Again, she is hemodynamically stable and does not have an acute abdomen so she does not need emergent surgical intervention. She would be at high risk of complications with surgery given her cirrhosis and splenomegaly.  I recommend serial hemoglobin hematocrit levels as well as bowel rest.  If she becomes hypotensive, she may require blood and we would then recommend an interventional radiology evaluation to consider arteriogram and embolization of the bleeding source.  We will follow her with you.  Complex medical decision making  Abigail Miyamoto MD 04/14/2023, 10:08 PM

## 2023-04-14 NOTE — Op Note (Signed)
[facility] Patient Name: Claudia Weber Procedure Date: 04/14/2023 1:44 PM MRN: [id number] Endoscopist: Meryl Dare , MD, [phone] Age: 72 Referring MD:  Date of Birth: 12-Oct-1950 Gender: Female Account #: [id number] Procedure:                Upper GI endoscopy Indications:              Screening procedure: cirrhosis, screen for varices Medicines:                Monitored Anesthesia Care Procedure:                Pre-Anesthesia Assessment:                           - Prior to the procedure, a History and Physical                            was performed, and patient medications and                            allergies were reviewed. The patient's tolerance of                            previous anesthesia was also reviewed. The risks                            and benefits of the procedure and the sedation                            options and risks were discussed with the patient.                            All questions were answered, and informed consent                            was obtained. Prior Anticoagulants: The patient has                            taken no anticoagulant or antiplatelet agents. ASA                            Grade Assessment: III - A patient with severe                            systemic disease. After reviewing the risks and                            benefits, the patient was deemed in satisfactory                            condition to undergo the procedure.                           After obtaining informed consent, the endoscope was  passed under direct vision. Throughout the                            procedure, the patient's blood pressure, pulse, and                            oxygen saturations were monitored continuously. The                            Olympus Scope [id number] was introduced through the                            mouth, and advanced to the second part of duodenum.                             The upper GI endoscopy was accomplished without                            difficulty. The patient tolerated the procedure                            well. Scope In: Scope Out: Findings:                 The examined esophagus was normal.                           Striped moderately erythematous mucosa without                            bleeding was found in the gastric body and in the                            gastric antrum. Biopsies were taken with a cold                            forceps for histology.                           The exam of the stomach was otherwise normal.                           The duodenal bulb and second portion of the                            duodenum were normal. Complications:            No immediate complications. Estimated Blood Loss:     Estimated blood loss was minimal. Impression:               - Normal esophagus.                           - Erythematous mucosa in the gastric body and  antrum. Biopsied.                           - Normal duodenal bulb and second portion of the                            duodenum. Recommendation:           - Patient has a contact number available for                            emergencies. The signs and symptoms of potential                            delayed complications were discussed with the                            patient. Return to normal activities tomorrow.                            Written discharge instructions were provided to the                            patient.                           - Resume previous diet.                           - Continue present medications.                           - Await pathology results.                           - Repeat upper endoscopy in 2-3 years for screening                            purposes. Meryl Dare, MD 04/14/2023 2:43:12 PM This report has been signed electronically.

## 2023-04-14 NOTE — Op Note (Signed)
[facility] Patient Name: Claudia Weber Procedure Date: 04/14/2023 1:44 PM MRN: [id number] Endoscopist: Meryl Dare , MD, [phone] Age: 72 Referring MD:  Date of Birth: [birth year] Gender: Female Account #: [id number] Procedure:                Colonoscopy Indications:              Surveillance: Personal history of adenomatous                            polyps on last colonoscopy 5 years ago Medicines:                Monitored Anesthesia Care Procedure:                Pre-Anesthesia Assessment:                           - Prior to the procedure, a History and Physical                            was performed, and patient medications and                            allergies were reviewed. The patient's tolerance of                            previous anesthesia was also reviewed. The risks                            and benefits of the procedure and the sedation                            options and risks were discussed with the patient.                            All questions were answered, and informed consent                            was obtained. Prior Anticoagulants: The patient has                            taken no anticoagulant or antiplatelet agents. ASA                            Grade Assessment: III - A patient with severe                            systemic disease. After reviewing the risks and                            benefits, the patient was deemed in satisfactory                            condition to undergo the procedure.  After obtaining informed consent, the colonoscope                            was passed under direct vision. Throughout the                            procedure, the patient's blood pressure, pulse, and                            oxygen saturations were monitored continuously. The                            CF [id number] #[id number] was introduced through the anus                            and advanced to  the the cecum, identified by                            appendiceal orifice and ileocecal valve. The                            ileocecal valve, appendiceal orifice, and rectum                            were photographed. The quality of the bowel                            preparation was good. The colonoscopy was somewhat                            difficult due to restricted mobility of the colon.                            The patient tolerated the procedure well. Scope In: 1:52:46 PM Scope Out: 2:15:03 PM Scope Withdrawal Time: 0 hours 14 minutes 7 seconds  Total Procedure Duration: 0 hours 22 minutes 17 seconds  Findings:                 The perianal and digital rectal examinations were                            normal.                           A 3 mm polyp was found in the appendiceal orifice.                            The polyp was sessile. The polyp was removed with a                            cold biopsy forceps. Resection and retrieval were                            complete.  Two sessile polyps were found in the descending                            colon and cecum. The polyps were 6 mm in size.                            These polyps were removed with a cold snare.                            Resection and retrieval were complete.                           Multiple medium-mouthed and small-mouthed                            diverticula were found in the sigmoid colon,                            descending colon and transverse colon. There was no                            evidence of diverticular bleeding.                           External hemorrhoids were found during                            retroflexion. The hemorrhoids were small.                           The exam was otherwise without abnormality on                            direct and retroflexion views. Complications:            No immediate complications. Estimated blood loss:                             None. Estimated Blood Loss:     Estimated blood loss: none. Impression:               - One 3 mm polyp at the appendiceal orifice,                            removed with a cold biopsy forceps. Resected and                            retrieved.                           - Two 6 mm polyps in the descending colon and in                            the cecum, removed with a cold snare. Resected and  retrieved.                           - Moderate diverticulosis in the sigmoid colon, in                            the descending colon and in the transverse colon.                           - External hemorrhoids.                           - The examination was otherwise normal on direct                            and retroflexion views. Recommendation:           - Repeat colonoscopy after studies are complete for                            surveillance based on pathology results.                           - Patient has a contact number available for                            emergencies. The signs and symptoms of potential                            delayed complications were discussed with the                            patient. Return to normal activities tomorrow.                            Written discharge instructions were provided to the                            patient.                           - Resume previous diet adding high fiber.                           - Continue present medications.                           - Await pathology results. Meryl Dare, MD 04/14/2023 2:28:06 PM This report has been signed electronically.

## 2023-04-15 ENCOUNTER — Telehealth: Payer: Self-pay | Admitting: *Deleted

## 2023-04-15 DIAGNOSIS — E039 Hypothyroidism, unspecified: Secondary | ICD-10-CM | POA: Diagnosis not present

## 2023-04-15 DIAGNOSIS — E66813 Obesity, class 3: Secondary | ICD-10-CM

## 2023-04-15 DIAGNOSIS — K661 Hemoperitoneum: Secondary | ICD-10-CM | POA: Diagnosis not present

## 2023-04-15 DIAGNOSIS — K7460 Unspecified cirrhosis of liver: Secondary | ICD-10-CM | POA: Diagnosis not present

## 2023-04-15 DIAGNOSIS — D62 Acute posthemorrhagic anemia: Secondary | ICD-10-CM

## 2023-04-15 LAB — PROTIME-INR
INR: 1.1 (ref 0.8–1.2)
Prothrombin Time: 14.6 s (ref 11.4–15.2)

## 2023-04-15 LAB — CBC WITH DIFFERENTIAL/PLATELET
Abs Immature Granulocytes: 0.03 10*3/uL (ref 0.00–0.07)
Basophils Absolute: 0 10*3/uL (ref 0.0–0.1)
Basophils Relative: 0 %
Eosinophils Absolute: 0 10*3/uL (ref 0.0–0.5)
Eosinophils Relative: 0 %
HCT: 32.6 % — ABNORMAL LOW (ref 36.0–46.0)
Hemoglobin: 10.7 g/dL — ABNORMAL LOW (ref 12.0–15.0)
Immature Granulocytes: 0 %
Lymphocytes Relative: 8 %
Lymphs Abs: 0.7 10*3/uL (ref 0.7–4.0)
MCH: 31.7 pg (ref 26.0–34.0)
MCHC: 32.8 g/dL (ref 30.0–36.0)
MCV: 96.4 fL (ref 80.0–100.0)
Monocytes Absolute: 0.6 10*3/uL (ref 0.1–1.0)
Monocytes Relative: 8 %
Neutro Abs: 6.9 10*3/uL (ref 1.7–7.7)
Neutrophils Relative %: 84 %
Platelets: 126 10*3/uL — ABNORMAL LOW (ref 150–400)
RBC: 3.38 MIL/uL — ABNORMAL LOW (ref 3.87–5.11)
RDW: 13.2 % (ref 11.5–15.5)
WBC: 8.2 10*3/uL (ref 4.0–10.5)
nRBC: 0 % (ref 0.0–0.2)

## 2023-04-15 LAB — COMPREHENSIVE METABOLIC PANEL
ALT: 43 U/L (ref 0–44)
AST: 56 U/L — ABNORMAL HIGH (ref 15–41)
Albumin: 3.5 g/dL (ref 3.5–5.0)
Alkaline Phosphatase: 73 U/L (ref 38–126)
Anion gap: 7 (ref 5–15)
BUN: 15 mg/dL (ref 8–23)
CO2: 26 mmol/L (ref 22–32)
Calcium: 8.8 mg/dL — ABNORMAL LOW (ref 8.9–10.3)
Chloride: 104 mmol/L (ref 98–111)
Creatinine, Ser: 0.41 mg/dL — ABNORMAL LOW (ref 0.44–1.00)
GFR, Estimated: >60 mL/min (ref >60)
Glucose, Bld: 142 mg/dL — ABNORMAL HIGH (ref 70–99)
Potassium: 4.2 mmol/L (ref 3.5–5.1)
Sodium: 137 mmol/L (ref 135–145)
Total Bilirubin: 1.6 mg/dL — ABNORMAL HIGH (ref <1.2)
Total Protein: 6.8 g/dL (ref 6.5–8.1)

## 2023-04-15 LAB — URINALYSIS, COMPLETE (UACMP) WITH MICROSCOPIC
Bacteria, UA: NONE SEEN
Bilirubin Urine: NEGATIVE
Glucose, UA: NEGATIVE mg/dL
Hgb urine dipstick: NEGATIVE
Ketones, ur: NEGATIVE mg/dL
Leukocytes,Ua: NEGATIVE
Nitrite: NEGATIVE
Protein, ur: 30 mg/dL — AB
pH: 5 (ref 5.0–8.0)

## 2023-04-15 LAB — HEMOGLOBIN AND HEMATOCRIT, BLOOD
HCT: 33.5 % — ABNORMAL LOW (ref 36.0–46.0)
Hemoglobin: 11.3 g/dL — ABNORMAL LOW (ref 12.0–15.0)

## 2023-04-15 LAB — MAGNESIUM: Magnesium: 2 mg/dL (ref 1.7–2.4)

## 2023-04-15 LAB — HEMOGLOBIN: Hemoglobin: 10.6 g/dL — ABNORMAL LOW (ref 12.0–15.0)

## 2023-04-15 MED ORDER — OXYCODONE HCL 5 MG PO TABS
5.0000 mg | ORAL_TABLET | ORAL | Status: DC | PRN
  Filled 2023-04-15: qty 1

## 2023-04-15 MED ORDER — SODIUM CHLORIDE 0.9 % IV SOLN
12.5000 mg | Freq: Four times a day (QID) | INTRAVENOUS | Status: DC | PRN
  Filled 2023-04-15: qty 0.5

## 2023-04-15 MED ORDER — LEVOTHYROXINE SODIUM 50 MCG PO TABS
50.0000 ug | ORAL_TABLET | Freq: Every day | ORAL | Status: DC
  Administered 2023-04-15: 50 ug via ORAL
  Filled 2023-04-15: qty 1

## 2023-04-15 MED ORDER — ONDANSETRON HCL 4 MG/2ML IJ SOLN
4.0000 mg | Freq: Once | INTRAMUSCULAR | Status: AC | PRN
  Administered 2023-04-15: 4 mg via INTRAVENOUS
  Filled 2023-04-15: qty 2

## 2023-04-15 NOTE — Hospital Course (Signed)
72 y.o. F with MO, hx endometrial CA s/p TAH 2018, NASH cirrhosis who presented with abdominal pain and emesis after an endoscopy.  Patient had upper and lower endoscopy on the day of admission.  Afterwards she developed severe LUQ pain, underwent a CT that showed moderate amount of intraperitoneal fluid c/w hemorrhage.   She remained hemodynamically stable.  Admitted and GI and Gen Surg consulted.

## 2023-04-15 NOTE — Plan of Care (Signed)
Communicated about pain medications. Verbalized understanding of what she wanted for pain.  Problem: Education: Goal: Knowledge of General Education information will improve Description: Including pain rating scale, medication(s)/side effects and non-pharmacologic comfort measures Outcome: Progressing   Problem: Pain Management: Goal: General experience of comfort will improve Outcome: Progressing

## 2023-04-15 NOTE — Assessment & Plan Note (Signed)
Transient. 

## 2023-04-15 NOTE — Progress Notes (Signed)
Progress Note     Subjective: Pt reports some upper abdominal pain. She did have some nausea and vomiting when getting up to the bathroom but really wants to try some ice chips and something to drink today. She was passing a lot of flatus yesterday. Son at bedside  Objective: Vital signs in last 24 hours: Temp:  [97.5 F (36.4 C)-98.1 F (36.7 C)] 97.8 F (36.6 C) (11/15 0607) Pulse Rate:  [71-94] 78 (11/15 0800) Resp:  [12-27] 16 (11/15 0800) BP: (110-214)/(64-145) 134/71 (11/15 0800) SpO2:  [90 %-100 %] 100 % (11/15 0800) Weight:  [122.5 kg] 122.5 kg (11/14 1250)    Intake/Output from previous day: No intake/output data recorded. Intake/Output this shift: No intake/output data recorded.  PE: General: pleasant, WD, obese female who is laying in bed in NAD Heart: regular, rate, and rhythm.  Lungs: CTAB, no wheezes, rhonchi, or rales noted.  Respiratory effort nonlabored Abd: soft, mild to mod epigastric ttp without peritonitis, mild distention, well healed surgical scars MS: all 4 extremities are symmetrical with no cyanosis, clubbing, or edema. Psych: A&Ox3 with an appropriate affect.    Lab Results:  Recent Labs    04/14/23 1638 04/15/23 0107 04/15/23 0415  WBC 7.9  --  8.2  HGB 11.8* 11.3* 10.7*  HCT 34.9* 33.5* 32.6*  PLT 143*  --  126*   BMET Recent Labs    04/14/23 1638 04/15/23 0415  NA 137 137  K 3.1* 4.2  CL 102 104  CO2 25 26  GLUCOSE 159* 142*  BUN 13 15  CREATININE 0.43* 0.41*  CALCIUM 8.8* 8.8*   PT/INR Recent Labs    04/14/23 2129 04/15/23 0415  LABPROT 14.9 14.6  INR 1.1 1.1   CMP     Component Value Date/Time   NA 137 04/15/2023 0415   K 4.2 04/15/2023 0415   CL 104 04/15/2023 0415   CO2 26 04/15/2023 0415   GLUCOSE 142 (H) 04/15/2023 0415   BUN 15 04/15/2023 0415   CREATININE 0.41 (L) 04/15/2023 0415   CALCIUM 8.8 (L) 04/15/2023 0415   PROT 6.8 04/15/2023 0415   ALBUMIN 3.5 04/15/2023 0415   AST 56 (H) 04/15/2023 0415    ALT 43 04/15/2023 0415   ALKPHOS 73 04/15/2023 0415   BILITOT 1.6 (H) 04/15/2023 0415   GFRNONAA >60 04/15/2023 0415   GFRAA >60 04/15/2019 1225   Lipase     Component Value Date/Time   LIPASE 66 (H) 04/14/2023 1638       Studies/Results: CT ABDOMEN PELVIS W CONTRAST  Addendum Date: 04/14/2023   ADDENDUM REPORT: 04/14/2023 21:16 ADDENDUM: These results were called by telephone at the time of interpretation on 04/14/2023 at 9:15 pm to provider JOSHUA LONG , who verbally acknowledged these results. Electronically Signed   By: Minerva Fester M.D.   On: 04/14/2023 21:16   Result Date: 04/14/2023 CLINICAL DATA:  Post colonoscopy abdominal pain. Bilateral upper abdominal pain and nausea since the procedure. EXAM: CT ABDOMEN AND PELVIS WITH CONTRAST TECHNIQUE: Multidetector CT imaging of the abdomen and pelvis was performed using the standard protocol following bolus administration of intravenous contrast. RADIATION DOSE REDUCTION: This exam was performed according to the departmental dose-optimization program which includes automated exposure control, adjustment of the mA and/or kV according to patient size and/or use of iterative reconstruction technique. CONTRAST:  OMNIPAQUE IOHEXOL 300 MG/ML  SOLN COMPARISON:  CT abdomen and pelvis 04/05/2023 FINDINGS: Lower chest: No acute abnormality. Hepatobiliary: Hepatic steatosis and cirrhosis. Cholecystectomy.  No biliary dilation. Pancreas/spleen: The enhancing lesion in the splenic hilum/tail of the pancreas areas seen previously is now more ill-defined and heterogenous. The central cystic component is no longer visualized. Exact measurement is difficult given silhouetting from adjacent hemorrhage but measures approximately 4.6 x 4.1 cm. There is moderate adjacent hyperdense fluid suspicious for hemorrhage. No evidence of active bleeding. The fluid tracks around the spleen and inferior to the greater curvature of the stomach. Additional fluid in  the pelvis. No free intraperitoneal air. The pancreas and spleen are otherwise unremarkable. Adrenals/Urinary Tract: Normal adrenal glands. No urinary calculi or hydronephrosis. Bladder is unremarkable. Stomach/Bowel: Normal caliber large and small bowel. Colonic diverticulosis without diverticulitis. No bowel wall thickening. Stomach is within normal limits. Vascular/Lymphatic: No significant vascular findings are present. No enlarged abdominal or pelvic lymph nodes. Reproductive: Hysterectomy. Other: No abdominal wall hernia. Musculoskeletal: No acute fracture. IMPRESSION: 1. Moderate hyperdense fluid in the upper abdomen suspicious for hemorrhage. The enhancing lesion in the splenic hilum/tail of the pancreas is now more ill-defined and heterogenous and is favored to represent the source of hemorrhage. 2. Hepatic steatosis and cirrhosis. 3. Colonic diverticulosis without diverticulitis. Electronically Signed: By: Minerva Fester M.D. On: 04/14/2023 21:13   DG Chest 2 View  Result Date: 04/14/2023 CLINICAL DATA:  Postprocedural pain. Colonoscopy today. Abdominal pain and nausea. EXAM: CHEST - 2 VIEW COMPARISON:  06/29/2022 FINDINGS: Stable cardiomediastinal silhouette. Aortic atherosclerotic calcification. Left basilar atelectasis/scarring. Otherwise no focal consolidation, pleural effusion, or pneumothorax. No displaced rib fractures. IMPRESSION: No acute cardiopulmonary disease. Electronically Signed   By: Minerva Fester M.D.   On: 04/14/2023 20:34    Anti-infectives: Anti-infectives (From admission, onward)    None        Assessment/Plan  Hemoperitoneum s/p colonoscopy and EGD yesterday - CT without active extravasation, no obvious liver or spleen injury, less well-defined known cystic mass in pancreas - likely bled from cystic pancreatic mass or blood vessels near this with distention of colon during colonoscopy  - hemodynamically stable, hgb is 10.7 from 11.3 this AM - continue to follow  serial hgb - ok to have CLD this AM but monitor closely for signs of ileus in setting of hemoperitoneum - no indication for acute surgical intervention  - ok to mobilize   FEN: CLD VTE: SCDs ID: no current abx  - per [facility] -  Cirrhosis of the liver  Hx of uterine cancer s/p complete hysterectomy  HTN Hypothyroidism   LOS: 1 day   I reviewed nursing notes, hospitalist notes, last 24 h vitals and pain scores, last 48 h intake and output, last 24 h labs and trends, and last 24 h imaging results.   Juliet Rude, [facility] Surgery 04/15/2023, 8:14 AM Please see Amion for pager number during day hours 7:00am-4:30pm

## 2023-04-15 NOTE — Assessment & Plan Note (Signed)
Suspected ruptured benign pancreatic cyst vs small vessel bleeding around colon.  Hgbs have been stable.  Abdomen benign.  Hemodynamically normal. - Serial abdominal exam - Follow Hgb in AM

## 2023-04-15 NOTE — Progress Notes (Signed)
  Progress Note   Patient: Claudia Weber UEA:[id number] DOB: 06-Oct-1950 DOA: 04/14/2023     1 DOS: the patient was seen and examined on 04/15/2023        Brief hospital course: 72 y.o. F with MO, hx endometrial CA s/p TAH 2018, NASH cirrhosis who presented with abdominal pain and emesis after an endoscopy.  Patient had upper and lower endoscopy on the day of admission.  Afterwards she developed severe LUQ pain, underwent a CT that showed moderate amount of intraperitoneal fluid c/w hemorrhage.   She remained hemodynamically stable.  Admitted and GI and Gen Surg consulted.     Assessment and Plan: * Hemoperitoneum Suspected ruptured benign pancreatic cyst vs small vessel bleeding around colon.  Hgbs have been stable.  Abdomen benign.  Hemodynamically normal. - Serial abdominal exam - Follow Hgb in AM    Class 3 obesity BMI 43  Acquired hypothyroidism - Continue levothyroxine  Essential hypertension BP near normal - Hold hydrochlorothiazide, lisinopril  Prolonged QT interval Resolved on ECG Today  Hypokalemia - Supplement K  Cirrhosis (HCC) Well compensated          Subjective: Pain better.  No vomiting.       Physical Exam: BP (!) 143/75 (BP Location: Left Arm)   Pulse 76   Temp (!) 97.4 F (36.3 C)   Resp 18   Ht 5\' 6"  (1.676 m)   Wt 122.5 kg   SpO2 99%   BMI 43.58 kg/m   Adult female, lying in bed, no acute distress, sleeping RRR no murmurs, no LE edema RR normal, lungs clear without rales or wheezes Abdomen with LUQ tenderness, mild, no guarding no rebound Attention normal, affect normal, lungs clear   Data Reviewed: Hgb stable at 10.7 Platelets 126K Creatinine normal, platelets normal   Family Communication: Daughter and son at bedside    Disposition: Status is: Inpatient         Author: Alberteen Sam, MD 04/15/2023 6:38 PM  For on call review [email or web address].

## 2023-04-15 NOTE — Telephone Encounter (Signed)
unable to leave message on f/u call,voicemail not set up yet

## 2023-04-15 NOTE — Consult Note (Addendum)
Consultation  Referring Provider:    Dr. Maryfrances Bunnell Primary Care Physician:  Erskine Emery, NP Primary Gastroenterologist: Dr. Russella Dar       Reason for Consultation: Abdominal pain after colonoscopy            HPI:   Claudia Weber is a 72 y.o. female with a past medical history as listed below including cirrhosis, hypothyroidism and multiple others, who was admitted to the hospital yesterday 04/14/2023 with hemoperitoneum after EGD and colonoscopy earlier in the day, complaining of abdominal pain.    04/14/2023 patient underwent EGD and colonoscopy with Dr. Russella Dar.  EGD was done for variceal screening and colonoscopy completed for history of polyps.  There were no reported intraoperative complications, however when patient was recovering postprocedure she started developing acute sharp generalized abdominal discomfort that was persistent and worse with palpitation over the abdomen.  Noted to have some nausea resulting in an episode of nonbloody, nonbilious emesis.   Today, patient explains that she is continuing to have some nausea.  Apparently they told her to try some clear liquids but they sat her up very fast and this caused increased nausea.  She still has some abdominal pain but it is "nothing like it was yesterday".  Now rated as a 3-4/10. She apparently received a dose of Morphine around 3:00 in the morning but has had no pain meds since.  In general feeling better from yesterday.      Her son is by her bedside and they tell me the surgical team has already been by and just plan to observe her given that her hemoglobin is stable.  They are hoping that she has no further bleeding.  Otherwise they would send her to interventional radiology for further management.  She has not yet had a bowel movement.    Denies fever, chills or weight loss.  ED course: Afebrile, systolic blood pressures in the 120s-150s, labs notable for CMP showing sodium 137, potassium 3.1, BUN 13, creatinine 0.43, alk phos  74, total bili 1.7, AST 59, ALT 40, lipase 66, CBC with a WBC of 7900, hemoglobin 11.8 (13.5 on 03/30/2023), platelets 143, INR 1.1, CT abdomen and pelvis with contrast showed moderate hyperdense fluid in the upper abdomen suggestive of hemorrhage, with the enhancing lesion in the splenic hilum/tail, the pancreas now more ill-defined and heterogenous, no evidence of free air to suggest bowel perforation  GI history: Recent EGD and colonoscopy as above on 04/14/2023 -Colonoscopy with one 3 mm polyp with appendiceal orifice, two 6 mm polyps in the descending colon and cecum and moderate diverticulosis in the sigmoid, descending and transverse colon as well as external hemorrhoid -EGD with normal esophagus, erythematous mucosa in the gastric body and antrum and normal duodenal bulb and second portion of the duodenum-repeat in 2 to 3 years for screening  Past Medical History:  Diagnosis Date   Cat scratch fever    Diverticulosis    Elevated LFTs    Hepatic cirrhosis (HCC)    High blood pressure    Other specified disorders of thyroid    Uterine cancer [facility])     Past Surgical History:  Procedure Laterality Date   ABDOMINAL HYSTERECTOMY  11/2016   BLADDER SURGERY     polyp removed   Breast cyst removal     CHOLECYSTECTOMY     KNEE SURGERY Left    arthroscopic   LIVER BIOPSY     right knee surgery      Family  History  Problem Relation Age of Onset   Throat cancer Mother    Lung cancer Mother    Colitis Mother        resection   Uterine cancer Sister    Hepatitis Brother    Heart disease Maternal Grandmother    Heart disease Maternal Grandfather    Liver cancer Cousin    Leukemia Cousin    Lung cancer Cousin    Colon cancer Cousin    Stomach cancer Neg Hx    Pancreatic cancer Neg Hx    Esophageal cancer Neg Hx    Rectal cancer Neg Hx     Social History   Tobacco Use   Smoking status: Never   Smokeless tobacco: Never  Vaping Use   Vaping status: Never Used  Substance  Use Topics   Alcohol use: Not Currently   Drug use: Never    Prior to Admission medications   Medication Sig Start Date End Date Taking? Authorizing Provider  Cholecalciferol (VITAMIN D3) 1000 units CAPS Take 1,000 Units by mouth at bedtime.   Yes [provider]  Cyanocobalamin (VITAMIN B-12) 2000 MCG TBCR Take 2,000 mcg by mouth daily.   Yes [provider]  hydrochlorothiazide (HYDRODIURIL) 12.5 MG tablet Take 12.5 mg by mouth daily.   Yes [provider]  levothyroxine (SYNTHROID, LEVOTHROID) 50 MCG tablet Take 50 mcg by mouth at bedtime.    Yes [provider]  lisinopril (ZESTRIL) 40 MG tablet Take 40 mg by mouth daily.   Yes [provider]  magnesium oxide (MAG-OX) 400 (241.3 Mg) MG tablet Take 400 mg by mouth at bedtime.  01/04/19  Yes [provider]  meclizine (ANTIVERT) 25 MG tablet Take 12.5 mg by mouth 2 (two) times daily as needed for dizziness. 08/04/22  Yes [provider]  Omega-3 1000 MG CAPS Take 1 g by mouth at bedtime.    Yes [provider]    Current Facility-Administered Medications  Medication Dose Route Frequency Provider Last Rate Last Admin   acetaminophen (TYLENOL) tablet 650 mg  650 mg Oral Q6H PRN Howerter, Justin B, DO       Or   acetaminophen (TYLENOL) suppository 650 mg  650 mg Rectal Q6H PRN Howerter, Justin B, DO       LORazepam (ATIVAN) injection 0.5 mg  0.5 mg Intravenous Q6H PRN Howerter, Justin B, DO       morphine (PF) 4 MG/ML injection 4 mg  4 mg Intravenous Q3H PRN Howerter, Justin B, DO   4 mg at 04/15/23 0300   naloxone (NARCAN) injection 0.4 mg  0.4 mg Intravenous PRN Howerter, Justin B, DO       Current Outpatient Medications  Medication Sig Dispense Refill   Cholecalciferol (VITAMIN D3) 1000 units CAPS Take 1,000 Units by mouth at bedtime.     Cyanocobalamin (VITAMIN B-12) 2000 MCG TBCR Take 2,000 mcg by mouth daily.     hydrochlorothiazide (HYDRODIURIL) 12.5 MG tablet  Take 12.5 mg by mouth daily.     levothyroxine (SYNTHROID, LEVOTHROID) 50 MCG tablet Take 50 mcg by mouth at bedtime.      lisinopril (ZESTRIL) 40 MG tablet Take 40 mg by mouth daily.     magnesium oxide (MAG-OX) 400 (241.3 Mg) MG tablet Take 400 mg by mouth at bedtime.      meclizine (ANTIVERT) 25 MG tablet Take 12.5 mg by mouth 2 (two) times daily as needed for dizziness.     Omega-3 1000 MG CAPS  Take 1 g by mouth at bedtime.       Allergies as of 04/14/2023 - Review Complete 04/14/2023  Allergen Reaction Noted   Asa [aspirin] Other (See Comments) 04/14/2023   Codeine Other (See Comments) 12/16/2017   Nsaids Other (See Comments) 04/15/2019     Review of Systems:    Constitutional: No weight loss, fever or chills Skin: No rash Cardiovascular: No chest pain Respiratory: No SOB  Gastrointestinal: See HPI and otherwise negative Genitourinary: No dysuria  Neurological: No headache, dizziness or syncope Musculoskeletal: No new muscle or joint pain Hematologic: No bleeding  Psychiatric: No history of depression or anxiety    Physical Exam:  Vital signs in last 24 hours: Temp:  [97.5 F (36.4 C)-98.1 F (36.7 C)] 97.8 F (36.6 C) (11/15 0607) Pulse Rate:  [71-94] 78 (11/15 0800) Resp:  [12-27] 16 (11/15 0800) BP: (110-214)/(64-145) 134/71 (11/15 0800) SpO2:  [90 %-100 %] 100 % (11/15 0800) Weight:  [122.5 kg] 122.5 kg (11/14 1250)   General:   Pleasant Caucasian female appears to be in NAD, Well developed, Well nourished, alert and cooperative Head:  Normocephalic and atraumatic. Eyes:   PEERL, EOMI. No icterus. Conjunctiva pink. Ears:  Normal auditory acuity. Neck:  Supple Throat: Oral cavity and pharynx without inflammation, swelling or lesion. Teeth in good condition. Lungs: Respirations even and unlabored. Lungs clear to auscultation bilaterally.   No wheezes, crackles, or rhonchi.  Heart: Normal S1, S2. No MRG. Regular rate and rhythm. No peripheral edema, cyanosis or  pallor.  Abdomen:  Soft, nondistended, mild to moderate epigastric TTP with some involuntary guarding, normal bowel sounds. No appreciable masses or hepatomegaly. Rectal:  Not performed.  Msk:  Symmetrical without gross deformities. Peripheral pulses intact.  Extremities:  Without edema, no deformity or joint abnormality. Normal ROM, normal sensation. Neurologic:  Alert and  oriented x4;  grossly normal neurologically.  Skin:   Dry and intact without significant lesions or rashes. Psychiatric: Demonstrates good judgement and reason without abnormal affect or behaviors.   LAB RESULTS: Recent Labs    04/14/23 1638 04/15/23 0107 04/15/23 0415  WBC 7.9  --  8.2  HGB 11.8* 11.3* 10.7*  HCT 34.9* 33.5* 32.6*  PLT 143*  --  126*   BMET Recent Labs    04/14/23 1638 04/15/23 0415  NA 137 137  K 3.1* 4.2  CL 102 104  CO2 25 26  GLUCOSE 159* 142*  BUN 13 15  CREATININE 0.43* 0.41*  CALCIUM 8.8* 8.8*   LFT Recent Labs    04/15/23 0415  PROT 6.8  ALBUMIN 3.5  AST 56*  ALT 43  ALKPHOS 73  BILITOT 1.6*   PT/INR Recent Labs    04/14/23 2129 04/15/23 0415  LABPROT 14.9 14.6  INR 1.1 1.1    STUDIES: CT ABDOMEN PELVIS W CONTRAST  Addendum Date: 04/14/2023   ADDENDUM REPORT: 04/14/2023 21:16 ADDENDUM: These results were called by telephone at the time of interpretation on 04/14/2023 at 9:15 pm to provider JOSHUA LONG , who verbally acknowledged these results. Electronically Signed   By: Minerva Fester M.D.   On: 04/14/2023 21:16   Result Date: 04/14/2023 CLINICAL DATA:  Post colonoscopy abdominal pain. Bilateral upper abdominal pain and nausea since the procedure. EXAM: CT ABDOMEN AND PELVIS WITH CONTRAST TECHNIQUE: Multidetector CT imaging of the abdomen and pelvis was performed using the standard protocol following bolus administration of intravenous contrast. RADIATION DOSE REDUCTION: This exam was performed according to the departmental dose-optimization program which  includes automated exposure control, adjustment of the mA and/or kV according to patient size and/or use of iterative reconstruction technique. CONTRAST:  OMNIPAQUE IOHEXOL 300 MG/ML  SOLN COMPARISON:  CT abdomen and pelvis 04/05/2023 FINDINGS: Lower chest: No acute abnormality. Hepatobiliary: Hepatic steatosis and cirrhosis. Cholecystectomy. No biliary dilation. Pancreas/spleen: The enhancing lesion in the splenic hilum/tail of the pancreas areas seen previously is now more ill-defined and heterogenous. The central cystic component is no longer visualized. Exact measurement is difficult given silhouetting from adjacent hemorrhage but measures approximately 4.6 x 4.1 cm. There is moderate adjacent hyperdense fluid suspicious for hemorrhage. No evidence of active bleeding. The fluid tracks around the spleen and inferior to the greater curvature of the stomach. Additional fluid in the pelvis. No free intraperitoneal air. The pancreas and spleen are otherwise unremarkable. Adrenals/Urinary Tract: Normal adrenal glands. No urinary calculi or hydronephrosis. Bladder is unremarkable. Stomach/Bowel: Normal caliber large and small bowel. Colonic diverticulosis without diverticulitis. No bowel wall thickening. Stomach is within normal limits. Vascular/Lymphatic: No significant vascular findings are present. No enlarged abdominal or pelvic lymph nodes. Reproductive: Hysterectomy. Other: No abdominal wall hernia. Musculoskeletal: No acute fracture. IMPRESSION: 1. Moderate hyperdense fluid in the upper abdomen suspicious for hemorrhage. The enhancing lesion in the splenic hilum/tail of the pancreas is now more ill-defined and heterogenous and is favored to represent the source of hemorrhage. 2. Hepatic steatosis and cirrhosis. 3. Colonic diverticulosis without diverticulitis. Electronically Signed: By: Minerva Fester M.D. On: 04/14/2023 21:13   DG Chest 2 View  Result Date: 04/14/2023 CLINICAL DATA:  Postprocedural  pain. Colonoscopy today. Abdominal pain and nausea. EXAM: CHEST - 2 VIEW COMPARISON:  06/29/2022 FINDINGS: Stable cardiomediastinal silhouette. Aortic atherosclerotic calcification. Left basilar atelectasis/scarring. Otherwise no focal consolidation, pleural effusion, or pneumothorax. No displaced rib fractures. IMPRESSION: No acute cardiopulmonary disease. Electronically Signed   By: Minerva Fester M.D.   On: 04/14/2023 20:34     Impression / Plan:   Impression: 1.  Hemoperitoneum: Patient admitted yesterday after EGD/colonoscopy with acute abdominal discomfort, CTAP with contrast shows hemorrhage without free air to suggest bowel perforation, slight interval drop in hemoglobin, 11.3 -->10.7 2.  Hypokalemia: Presenting with a potassium level noted to be 3.1 3.  QTc prolongation 4.  MASH cirrhosis: No noted portal hypertension, MELD 10 5.  Hypertension 6.  Hypothyroidism  Plan: 1.  Continue to monitor hemoglobin and transfusion as needed less than 7 2.  Surgical team has allowed a clear liquid diet this morning. Appreciate their recommendations going forward. 3.  Ordered Hgb now and then q8 hours 4.  Discussed with patient that Dr. Russella Dar would be by this morning  Thank you for your kind consultation.  Violet Baldy [facility]  04/15/2023, 8:37 AM    Attending Physician Note   I have taken a history, reviewed the chart and examined the patient. I performed a substantive portion of this encounter, including complete performance of at least one of the key components, in conjunction with the APP. I agree with the APP's note, impression and recommendations with my edits. My additional impressions and recommendations are as follows.    Hemoperitoneum with ABL anemia following colonoscopy/EGD yesterday. Abdominal pain has significantly improved. Nausea and intermittent vomiting persist. Not tolerating clears. Mild tenderness to palpation in LUQ, epigastrium, no distention, normal BS.  Hgb stable  overnight.  CT AP: hemoperitoneum without active extravasation with chronic splenic hilum / pancreatic tail cystic lesion now more ill-defined and favored to be the source of hemorrhage.  Appreciate  general surgery and hospitalists care. Trend CBC. Clear liquids as tolerated today. Observe for rebleeding, possible ileus development.  Her son and DIL were at the bedside during my visit. I reviewed her procedure related bleed, source appears to be splenic hilum / pancreatic tail cystic lesion, and the management plans in detail to include conservative measures for now since bleeding has stopped, possible IR embolization and possible surgery if she has a significant rebleed.  Ample time was allowed for questions and I addressed their questions to their satisfaction.    History of frequently recurrent diverticulitis. Moderate sigmoid & descending diverticulosis with restricted mobility of the left colon. Mild transverse colon diverticulosis. No evidence of recurrent diverticulitis on colonoscopy or CT.   MASH cirrhosis, MELD=10  Personal history of adenomatous colon polyps. 3 small polyps removed yesterday at colonoscopy by cold biopsy and cold snare. Path pending.   Hypertension  Prolonged QTc interval   Hypokalemia on admit, now corrected.  GI will follow. Dr. Meridee Score covering later today and this weekend.  Claudette Head, MD [facility] See AMION, [facility], for our on call provider

## 2023-04-15 NOTE — Assessment & Plan Note (Signed)
Resolved with supplementation and starting spironolactone. 

## 2023-04-15 NOTE — ED Notes (Signed)
ED TO INPATIENT HANDOFF REPORT  ED Nurse Name and Phone #:  Mellody Dance  -  [phone]  S Name/Age/Gender Claudia Weber 72 y.o. female Room/Bed: WA08/WA08  Code Status   Code Status: Full Code  Home/SNF/Other Home Patient oriented to: self, place, time, and situation Is this baseline? Yes   Triage Complete: Triage complete  Chief Complaint Hemoperitoneum [K66.1]  Triage Note EMS reports from [place] post colonoscopy today, c/o bilateral upper abdominal pain and nausea since procedure. Staff states Pt has and is still passing gas.   BP 123/57 HR 73 RR 16 Sp02 98 RA  22ga R forearm.     Allergies Allergies  Allergen Reactions   Asa [Aspirin] Other (See Comments)    Liver issue   Codeine Other (See Comments)    Made the patient "sick"   Nsaids Other (See Comments)    Liver problems     Level of Care/Admitting Diagnosis ED Disposition     ED Disposition  Admit   Condition  --   Comment  Hospital Area: [facility] [[id number]]  Level of Care: Med-Surg [16]  May admit patient to [facility] or [facility] if equivalent level of care is available:: No  Covid Evaluation: Asymptomatic - no recent exposure (last 10 days) testing not required  Diagnosis: Hemoperitoneum [130865]  Admitting Physician: Alberteen Sam [[id number]]  Attending Physician: Alberteen Sam [[id number]]  Certification:: I certify this patient will need inpatient services for at least 2 midnights          B Medical/Surgery History Past Medical History:  Diagnosis Date   Cat scratch fever    Diverticulosis    Elevated LFTs    Hepatic cirrhosis (HCC)    High blood pressure    Other specified disorders of thyroid    Uterine cancer [facility])    Past Surgical History:  Procedure Laterality Date   ABDOMINAL HYSTERECTOMY  11/2016   BLADDER SURGERY     polyp removed   Breast cyst removal     CHOLECYSTECTOMY     KNEE SURGERY Left    arthroscopic   LIVER BIOPSY      right knee surgery       A IV Location/Drains/Wounds Patient Lines/Drains/Airways Status     Active Line/Drains/Airways     Name Placement date Placement time Site Days   Peripheral IV 04/14/23 20 G Anterior;Left;Proximal Forearm 04/14/23  1641  Forearm  1            Intake/Output Last 24 hours No intake or output data in the 24 hours ending 04/15/23 0831  Labs/Imaging Results for orders placed or performed during the hospital encounter of 04/14/23 (from the past 48 hour(s))  Comprehensive metabolic panel     Status: Abnormal   Collection Time: 04/14/23  4:38 PM  Result Value Ref Range   Sodium 137 135 - 145 mmol/L   Potassium 3.1 (L) 3.5 - 5.1 mmol/L   Chloride 102 98 - 111 mmol/L   CO2 25 22 - 32 mmol/L   Glucose, Bld 159 (H) 70 - 99 mg/dL    Comment: Glucose reference range applies only to samples taken after fasting for at least 8 hours.   BUN 13 8 - 23 mg/dL   Creatinine, Ser 2.44 (L) 0.44 - 1.00 mg/dL   Calcium 8.8 (L) 8.9 - 10.3 mg/dL   Total Protein 7.0 6.5 - 8.1 g/dL   Albumin 3.7 3.5 - 5.0 g/dL   AST 59 (H) 15 -  41 U/L   ALT 40 0 - 44 U/L   Alkaline Phosphatase 74 38 - 126 U/L   Total Bilirubin 1.7 (H) <1.2 mg/dL   GFR, Estimated >82 >95 mL/min    Comment: (NOTE) Calculated using the CKD-EPI Creatinine Equation (2021)    Anion gap 10 5 - 15    Comment: Performed at [facility], [address], [place], [place] [address]  Lipase, blood     Status: Abnormal   Collection Time: 04/14/23  4:38 PM  Result Value Ref Range   Lipase 66 (H) 11 - 51 U/L    Comment: Performed at [facility], [address], [place], [place] [address]  CBC with Differential     Status: Abnormal   Collection Time: 04/14/23  4:38 PM  Result Value Ref Range   WBC 7.9 4.0 - 10.5 K/uL   RBC 3.68 (L) 3.87 - 5.11 MIL/uL   Hemoglobin 11.8 (L) 12.0 - 15.0 g/dL   HCT 46.9 (L) 62.9 - 52.8 %   MCV 94.8 80.0 - 100.0 fL   MCH 32.1 26.0 - 34.0 pg    MCHC 33.8 30.0 - 36.0 g/dL   RDW 41.3 24.4 - 01.0 %   Platelets 143 (L) 150 - 400 K/uL   nRBC 0.0 0.0 - 0.2 %   Neutrophils Relative % 77 %   Neutro Abs 6.1 1.7 - 7.7 K/uL   Lymphocytes Relative 14 %   Lymphs Abs 1.1 0.7 - 4.0 K/uL   Monocytes Relative 7 %   Monocytes Absolute 0.6 0.1 - 1.0 K/uL   Eosinophils Relative 1 %   Eosinophils Absolute 0.0 0.0 - 0.5 K/uL   Basophils Relative 0 %   Basophils Absolute 0.0 0.0 - 0.1 K/uL   Immature Granulocytes 1 %   Abs Immature Granulocytes 0.04 0.00 - 0.07 K/uL    Comment: Performed at [facility], [address], [place], [place] [address]  Protime-INR     Status: None   Collection Time: 04/14/23  9:29 PM  Result Value Ref Range   Prothrombin Time 14.9 11.4 - 15.2 seconds   INR 1.1 0.8 - 1.2    Comment: (NOTE) INR goal varies based on device and disease states. Performed at [facility], [address], [place], [place] [address]   Type and screen [facility]     Status: None   Collection Time: 04/14/23  9:29 PM  Result Value Ref Range   ABO/RH(D) A POS    Antibody Screen NEG    Sample Expiration      04/17/2023,2359 Performed at [facility], [address] [place], [place] [address]   ABO/Rh     Status: None   Collection Time: 04/14/23  9:34 PM  Result Value Ref Range   ABO/RH(D)      A POS Performed at [facility], [address] [place], [place] [address]   Magnesium     Status: None   Collection Time: 04/14/23  9:59 PM  Result Value Ref Range   Magnesium 1.9 1.7 - 2.4 mg/dL    Comment: Performed at [facility], [address] [place], [place] [address]  APTT     Status: None   Collection Time: 04/14/23  9:59 PM  Result Value Ref Range   aPTT 26 24 - 36 seconds    Comment: Performed at [facility], [address] [place],  [place] [address]  Urinalysis, Complete w Microscopic  -Urine, Clean Catch     Status: Abnormal   Collection Time: 04/15/23  1:02 AM  Result Value Ref Range   Color, Urine YELLOW YELLOW   APPearance HAZY (A) CLEAR   Specific Gravity, Urine RESULTS UNAVAILABLE DUE TO INTERFERING SUBSTANCE 1.005 - 1.030   pH 5.0 5.0 - 8.0   Glucose, UA NEGATIVE NEGATIVE mg/dL   Hgb urine dipstick NEGATIVE NEGATIVE   Bilirubin Urine NEGATIVE NEGATIVE   Ketones, ur NEGATIVE NEGATIVE mg/dL   Protein, ur 30 (A) NEGATIVE mg/dL   Nitrite NEGATIVE NEGATIVE   Leukocytes,Ua NEGATIVE NEGATIVE   RBC / HPF 0-5 0 - 5 RBC/hpf   WBC, UA 0-5 0 - 5 WBC/hpf   Bacteria, UA NONE SEEN NONE SEEN   Squamous Epithelial / HPF 0-5 0 - 5 /HPF   Mucus PRESENT    Hyaline Casts, UA PRESENT     Comment: Performed at [facility], [address] [place], [place] [address]  Hemoglobin and hematocrit, blood     Status: Abnormal   Collection Time: 04/15/23  1:07 AM  Result Value Ref Range   Hemoglobin 11.3 (L) 12.0 - 15.0 g/dL   HCT 09.8 (L) 11.9 - 14.7 %    Comment: Performed at [facility], [address] [place], [place] [address]  CBC with Differential/Platelet     Status: Abnormal   Collection Time: 04/15/23  4:15 AM  Result Value Ref Range   WBC 8.2 4.0 - 10.5 K/uL   RBC 3.38 (L) 3.87 - 5.11 MIL/uL   Hemoglobin 10.7 (L) 12.0 - 15.0 g/dL   HCT 21.3 (L) 08.6 - 57.8 %   MCV 96.4 80.0 - 100.0 fL   MCH 31.7 26.0 - 34.0 pg   MCHC 32.8 30.0 - 36.0 g/dL   RDW 46.9 62.9 - 52.8 %   Platelets 126 (L) 150 - 400 K/uL   nRBC 0.0 0.0 - 0.2 %   Neutrophils Relative % 84 %   Neutro Abs 6.9 1.7 - 7.7 K/uL   Lymphocytes Relative 8 %   Lymphs Abs 0.7 0.7 - 4.0 K/uL   Monocytes Relative 8 %   Monocytes Absolute 0.6 0.1 - 1.0 K/uL   Eosinophils Relative 0 %   Eosinophils Absolute 0.0 0.0 - 0.5 K/uL   Basophils Relative 0 %   Basophils Absolute 0.0 0.0 - 0.1 K/uL   Immature Granulocytes 0 %   Abs Immature Granulocytes 0.03 0.00 - 0.07 K/uL     Comment: Performed at [facility], [address], [place], [place] [address]  Comprehensive metabolic panel     Status: Abnormal   Collection Time: 04/15/23  4:15 AM  Result Value Ref Range   Sodium 137 135 - 145 mmol/L   Potassium 4.2 3.5 - 5.1 mmol/L   Chloride 104 98 - 111 mmol/L   CO2 26 22 - 32 mmol/L   Glucose, Bld 142 (H) 70 - 99 mg/dL    Comment: Glucose reference range applies only to samples taken after fasting for at least 8 hours.   BUN 15 8 - 23 mg/dL   Creatinine, Ser 4.01 (L) 0.44 - 1.00 mg/dL   Calcium 8.8 (L) 8.9 - 10.3 mg/dL   Total Protein 6.8 6.5 - 8.1 g/dL   Albumin 3.5 3.5 - 5.0 g/dL   AST 56 (H) 15 - 41 U/L   ALT 43 0 - 44 U/L   Alkaline Phosphatase  73 38 - 126 U/L   Total Bilirubin 1.6 (H) <1.2 mg/dL   GFR, Estimated >52 >84 mL/min    Comment: (NOTE) Calculated using the CKD-EPI Creatinine Equation (2021)    Anion gap 7 5 - 15    Comment: Performed at [facility], [address] [place], [place] [address]  Magnesium     Status: None   Collection Time: 04/15/23  4:15 AM  Result Value Ref Range   Magnesium 2.0 1.7 - 2.4 mg/dL    Comment: Performed at [facility], [address] [place], [place] [address]  Protime-INR     Status: None   Collection Time: 04/15/23  4:15 AM  Result Value Ref Range   Prothrombin Time 14.6 11.4 - 15.2 seconds   INR 1.1 0.8 - 1.2    Comment: (NOTE) INR goal varies based on device and disease states. Performed at [facility], [address] [place], [place] [address]    CT ABDOMEN PELVIS W CONTRAST  Addendum Date: 04/14/2023   ADDENDUM REPORT: 04/14/2023 21:16 ADDENDUM: These results were called by telephone at the time of interpretation on 04/14/2023 at 9:15 pm to provider JOSHUA LONG , who verbally acknowledged these results. Electronically Signed   By: Minerva Fester M.D.   On: 04/14/2023 21:16   Result Date: 04/14/2023 CLINICAL DATA:   Post colonoscopy abdominal pain. Bilateral upper abdominal pain and nausea since the procedure. EXAM: CT ABDOMEN AND PELVIS WITH CONTRAST TECHNIQUE: Multidetector CT imaging of the abdomen and pelvis was performed using the standard protocol following bolus administration of intravenous contrast. RADIATION DOSE REDUCTION: This exam was performed according to the departmental dose-optimization program which includes automated exposure control, adjustment of the mA and/or kV according to patient size and/or use of iterative reconstruction technique. CONTRAST:  OMNIPAQUE IOHEXOL 300 MG/ML  SOLN COMPARISON:  CT abdomen and pelvis 04/05/2023 FINDINGS: Lower chest: No acute abnormality. Hepatobiliary: Hepatic steatosis and cirrhosis. Cholecystectomy. No biliary dilation. Pancreas/spleen: The enhancing lesion in the splenic hilum/tail of the pancreas areas seen previously is now more ill-defined and heterogenous. The central cystic component is no longer visualized. Exact measurement is difficult given silhouetting from adjacent hemorrhage but measures approximately 4.6 x 4.1 cm. There is moderate adjacent hyperdense fluid suspicious for hemorrhage. No evidence of active bleeding. The fluid tracks around the spleen and inferior to the greater curvature of the stomach. Additional fluid in the pelvis. No free intraperitoneal air. The pancreas and spleen are otherwise unremarkable. Adrenals/Urinary Tract: Normal adrenal glands. No urinary calculi or hydronephrosis. Bladder is unremarkable. Stomach/Bowel: Normal caliber large and small bowel. Colonic diverticulosis without diverticulitis. No bowel wall thickening. Stomach is within normal limits. Vascular/Lymphatic: No significant vascular findings are present. No enlarged abdominal or pelvic lymph nodes. Reproductive: Hysterectomy. Other: No abdominal wall hernia. Musculoskeletal: No acute fracture. IMPRESSION: 1. Moderate hyperdense fluid in the upper abdomen  suspicious for hemorrhage. The enhancing lesion in the splenic hilum/tail of the pancreas is now more ill-defined and heterogenous and is favored to represent the source of hemorrhage. 2. Hepatic steatosis and cirrhosis. 3. Colonic diverticulosis without diverticulitis. Electronically Signed: By: Minerva Fester M.D. On: 04/14/2023 21:13   DG Chest 2 View  Result Date: 04/14/2023 CLINICAL DATA:  Postprocedural pain. Colonoscopy today. Abdominal pain and nausea. EXAM: CHEST - 2 VIEW COMPARISON:  06/29/2022 FINDINGS: Stable cardiomediastinal silhouette. Aortic atherosclerotic calcification. Left basilar atelectasis/scarring. Otherwise no focal consolidation, pleural effusion, or pneumothorax. No displaced rib fractures. IMPRESSION: No  acute cardiopulmonary disease. Electronically Signed   By: Minerva Fester M.D.   On: 04/14/2023 20:34    Pending Labs Unresulted Labs (From admission, onward)    None       Vitals/Pain Today's Vitals   04/15/23 0630 04/15/23 0645 04/15/23 0700 04/15/23 0800  BP:   123/74 134/71  Pulse: 78 86 94 78  Resp:    16  Temp:      TempSrc:      SpO2: 97% 98% 100% 100%  PainSc:        Isolation Precautions No active isolations  Medications Medications  acetaminophen (TYLENOL) tablet 650 mg (has no administration in time range)    Or  acetaminophen (TYLENOL) suppository 650 mg (has no administration in time range)  naloxone [facility]) injection 0.4 mg (has no administration in time range)  morphine (PF) 4 MG/ML injection 4 mg (4 mg Intravenous Given 04/15/23 0300)  LORazepam (ATIVAN) injection 0.5 mg (has no administration in time range)  sodium chloride 0.9 % bolus 500 mL (0 mLs Intravenous Stopped 04/14/23 1756)  morphine (PF) 4 MG/ML injection 4 mg (4 mg Intravenous Given 04/14/23 1641)  ondansetron (ZOFRAN) injection 4 mg (4 mg Intravenous Given 04/14/23 1642)  morphine (PF) 4 MG/ML injection 4 mg (4 mg Intravenous Given 04/14/23 1727)  iohexol  (OMNIPAQUE) 300 MG/ML solution 100 mL (100 mLs Intravenous Contrast Given 04/14/23 1736)  morphine (PF) 4 MG/ML injection 4 mg (4 mg Intravenous Given 04/14/23 2008)  metoCLOPramide (REGLAN) injection 5 mg (5 mg Intravenous Given 04/14/23 2041)  ondansetron (ZOFRAN) injection 4 mg (4 mg Intravenous Given 04/15/23 0602)    Mobility walks     Focused Assessments    R Recommendations: See Admitting Provider Note  Report given to:   Additional Notes:

## 2023-04-15 NOTE — ED Notes (Signed)
Pt became nauseous and began dry heaving after sitting back in the bed once she was done using the bedside commode.

## 2023-04-15 NOTE — Assessment & Plan Note (Signed)
Well compensated 

## 2023-04-15 NOTE — Assessment & Plan Note (Signed)
BMI 43 

## 2023-04-16 DIAGNOSIS — R933 Abnormal findings on diagnostic imaging of other parts of digestive tract: Secondary | ICD-10-CM | POA: Diagnosis not present

## 2023-04-16 DIAGNOSIS — K869 Disease of pancreas, unspecified: Secondary | ICD-10-CM

## 2023-04-16 DIAGNOSIS — D62 Acute posthemorrhagic anemia: Secondary | ICD-10-CM | POA: Diagnosis not present

## 2023-04-16 DIAGNOSIS — K661 Hemoperitoneum: Secondary | ICD-10-CM | POA: Diagnosis not present

## 2023-04-16 LAB — URINALYSIS, W/ REFLEX TO CULTURE (INFECTION SUSPECTED)
Bilirubin Urine: NEGATIVE
Glucose, UA: NEGATIVE mg/dL
Hgb urine dipstick: NEGATIVE
Ketones, ur: NEGATIVE mg/dL
Nitrite: NEGATIVE
Protein, ur: NEGATIVE mg/dL
Specific Gravity, Urine: 1.028 (ref 1.005–1.030)
pH: 5 (ref 5.0–8.0)

## 2023-04-16 LAB — CBC
HCT: 31.4 % — ABNORMAL LOW (ref 36.0–46.0)
HCT: 30.1 % — ABNORMAL LOW (ref 36.0–46.0)
Hemoglobin: 10 g/dL — ABNORMAL LOW (ref 12.0–15.0)
Hemoglobin: 10.1 g/dL — ABNORMAL LOW (ref 12.0–15.0)
MCH: 31.5 pg (ref 26.0–34.0)
MCH: 32.4 pg (ref 26.0–34.0)
MCHC: 31.8 g/dL (ref 30.0–36.0)
MCHC: 33.6 g/dL (ref 30.0–36.0)
MCV: 99.1 fL (ref 80.0–100.0)
MCV: 96.5 fL (ref 80.0–100.0)
Platelets: 107 10*3/uL — ABNORMAL LOW (ref 150–400)
Platelets: 112 10*3/uL — ABNORMAL LOW (ref 150–400)
RBC: 3.17 MIL/uL — ABNORMAL LOW (ref 3.87–5.11)
RBC: 3.12 MIL/uL — ABNORMAL LOW (ref 3.87–5.11)
RDW: 13.2 % (ref 11.5–15.5)
RDW: 13.2 % (ref 11.5–15.5)
WBC: 5.6 10*3/uL (ref 4.0–10.5)
WBC: 6.8 10*3/uL (ref 4.0–10.5)
nRBC: 0 % (ref 0.0–0.2)
nRBC: 0 % (ref 0.0–0.2)

## 2023-04-16 LAB — BASIC METABOLIC PANEL
Anion gap: 6 (ref 5–15)
BUN: 15 mg/dL (ref 8–23)
CO2: 28 mmol/L (ref 22–32)
Calcium: 8.5 mg/dL — ABNORMAL LOW (ref 8.9–10.3)
Chloride: 102 mmol/L (ref 98–111)
Creatinine, Ser: 0.67 mg/dL (ref 0.44–1.00)
GFR, Estimated: >60 mL/min (ref >60)
Glucose, Bld: 112 mg/dL — ABNORMAL HIGH (ref 70–99)
Potassium: 3.6 mmol/L (ref 3.5–5.1)
Sodium: 136 mmol/L (ref 135–145)

## 2023-04-16 MED ORDER — CEFADROXIL 500 MG PO CAPS
500.0000 mg | ORAL_CAPSULE | Freq: Two times a day (BID) | ORAL | 0 refills | Status: AC
Start: 2023-04-16 — End: ?

## 2023-04-16 NOTE — Plan of Care (Signed)
Patient verbalized understanding understanding instructions.  Problem: Education: Goal: Knowledge of General Education information will improve Description: Including pain rating scale, medication(s)/side effects and non-pharmacologic comfort measures Outcome: Adequate for Discharge   Problem: Health Behavior/Discharge Planning: Goal: Ability to manage health-related needs will improve Outcome: Adequate for Discharge   Problem: Clinical Measurements: Goal: Ability to maintain clinical measurements within normal limits will improve Outcome: Adequate for Discharge Goal: Will remain free from infection Outcome: Adequate for Discharge Goal: Diagnostic test results will improve Outcome: Adequate for Discharge Goal: Respiratory complications will improve Outcome: Adequate for Discharge Goal: Cardiovascular complication will be avoided Outcome: Adequate for Discharge   Problem: Activity: Goal: Risk for activity intolerance will decrease Outcome: Adequate for Discharge   Problem: Nutrition: Goal: Adequate nutrition will be maintained Outcome: Adequate for Discharge   Problem: Coping: Goal: Level of anxiety will decrease Outcome: Adequate for Discharge   Problem: Elimination: Goal: Will not experience complications related to bowel motility Outcome: Adequate for Discharge Goal: Will not experience complications related to urinary retention Outcome: Adequate for Discharge   Problem: Pain Management: Goal: General experience of comfort will improve Outcome: Adequate for Discharge   Problem: Safety: Goal: Ability to remain free from injury will improve Outcome: Adequate for Discharge   Problem: Skin Integrity: Goal: Risk for impaired skin integrity will decrease Outcome: Adequate for Discharge

## 2023-04-16 NOTE — Progress Notes (Signed)
Patient able to get up and ambulate on the unit. She was able to eat breakfast and lunch ordered and tolerate. No complaints of nausea or pain.  Was able to see medical staff for discharge information.

## 2023-04-16 NOTE — Progress Notes (Signed)
Pt d/c'd before TOC assessment could be completed. 

## 2023-04-16 NOTE — Discharge Summary (Signed)
Physician Discharge Summary   Patient: Claudia Weber MRN: [id number] DOB: 1950/09/12  Admit date:     04/14/2023  Discharge date: 04/16/23  Discharge Physician: Alberteen Sam   PCP: Erskine Emery, NP     Recommendations at discharge:  Follow up with PCP Drusilla Kanner in 1 week for intra-abdominal hemorrhage and UTI Drusilla Kanner: Please obtain CBC in 1 week  Follow up with Dr. Russella Dar as recommended     Discharge Diagnoses: Principal Problem:   Hemoperitoneum Active Problems:   Cirrhosis (HCC)   Hypokalemia   Prolonged QT interval, resolved   Essential hypertension   Acquired hypothyroidism   Class 3 obesity      Hospital Course: 72 y.o. F with MO, hx endometrial CA s/p TAH 2018, NASH cirrhosis who presented with abdominal pain and emesis after an endoscopy.  Patient had upper and lower endoscopy on the day of admission.  Afterwards she developed severe LUQ pain, underwent a CT that showed moderate amount of intraperitoneal fluid c/w hemorrhage.   She remained hemodynamically stable.  Admitted and GI and Gen Surg consulted.   * Hemoperitoneum Admitted and GI and Gen surg consulted.  Serial abdominal exams were reassuring and Hgb remained stable.  GI suspected this was a ruptured benign pancreatic cyst or splenule vs small vessel bleeding around colon.   She was able to advance her diet without difficulty and was discharged with GI follow up pending.   Acute cystitis, possible Developed dysuria.  UA showed 1+ leuks and rare bacteria.  Provided with cefadroxil five days, to take if symptoms persist after oral hydration.  Class 3 obesity BMI 43  Acquired hypothyroidism    Essential hypertension    Prolonged QT interval Transient  Hypokalemia Supplemented  Cirrhosis (HCC) Well compensated            The [facility] Controlled Substances Registry was reviewed for this patient prior to discharge.  Consultants: Gastroenterology,  General Surgery  Procedures performed: CT abdomen   Disposition: Home Diet recommendation:  Regular diet  DISCHARGE MEDICATION: Allergies as of 04/16/2023       Reactions   Asa [aspirin] Other (See Comments)   Liver issue   Codeine Other (See Comments)   Made the patient "sick"   Nsaids Other (See Comments)   Liver problems         Medication List     TAKE these medications    cefadroxil 500 MG capsule Commonly known as: DURICEF Take 1 capsule (500 mg total) by mouth 2 (two) times daily.   hydrochlorothiazide 12.5 MG tablet Commonly known as: HYDRODIURIL Take 12.5 mg by mouth daily.   levothyroxine 50 MCG tablet Commonly known as: SYNTHROID Take 50 mcg by mouth at bedtime.   lisinopril 40 MG tablet Commonly known as: ZESTRIL Take 40 mg by mouth daily.   magnesium oxide 400 (241.3 Mg) MG tablet Commonly known as: MAG-OX Take 400 mg by mouth at bedtime.   meclizine 25 MG tablet Commonly known as: ANTIVERT Take 12.5 mg by mouth 2 (two) times daily as needed for dizziness.   Omega-3 1000 MG Caps Take 1 g by mouth at bedtime.   Vitamin B-12 2000 MCG Tbcr Take 2,000 mcg by mouth daily.   Vitamin D3 1000 units Caps Take 1,000 Units by mouth at bedtime.        Follow-up Information     Erskine Emery, NP Follow up.   Contact information: [address] [place] [address]  [phone]         Meryl Dare, MD Follow up.   Specialty: Gastroenterology Contact information: [address] [place] [address] [phone]                 Discharge Instructions     Discharge instructions   Complete by: As directed    **IMPORTANT DISCHARGE INSTRUCTIONS**   From Dr. Maryfrances Bunnell: You were admitted for pain and findings of some leaked blood in the abdomen.  We can't be certain where the blood came from, but we speculate that it came from the benign pancreatic cyst you had or from a small splenule (piece of splenic  tissue).  Thankfully, it appears to be stable and no signs suggest ongoing bleeding  It also may be that you have a urinary tract infection  Push fluids for the next 24 hours.  If symptoms persist, you may take the cefadroxil 500 mg twice daily for 5 days  Follow up with your primary care physician and with Dr. Ardell Isaacs office   Increase activity slowly   Complete by: As directed        Discharge Exam: Filed Weights   04/15/23 1833  Weight: 122.5 kg    General: Pt is alert, awake, not in acute distress Cardiovascular: RRR, nl S1-S2, no murmurs appreciated.   No LE edema.   Respiratory: Normal respiratory rate and rhythm.  CTAB without rales or wheezes. Abdominal: Abdomen soft mild tenderness, mild guarding in LUQ, overall improved.  No distension or HSM.   Neuro/Psych: Strength symmetric in upper and lower extremities.  Judgment and insight appear normal.   Condition at discharge: good  The results of significant diagnostics from this hospitalization (including imaging, microbiology, ancillary and laboratory) are listed below for reference.   Imaging Studies: CT ABDOMEN PELVIS W CONTRAST  Addendum Date: 04/14/2023   ADDENDUM REPORT: 04/14/2023 21:16 ADDENDUM: These results were called by telephone at the time of interpretation on 04/14/2023 at 9:15 pm to provider JOSHUA LONG , who verbally acknowledged these results. Electronically Signed   By: Minerva Fester M.D.   On: 04/14/2023 21:16   Result Date: 04/14/2023 CLINICAL DATA:  Post colonoscopy abdominal pain. Bilateral upper abdominal pain and nausea since the procedure. EXAM: CT ABDOMEN AND PELVIS WITH CONTRAST TECHNIQUE: Multidetector CT imaging of the abdomen and pelvis was performed using the standard protocol following bolus administration of intravenous contrast. RADIATION DOSE REDUCTION: This exam was performed according to the departmental dose-optimization program which includes automated exposure control, adjustment  of the mA and/or kV according to patient size and/or use of iterative reconstruction technique. CONTRAST:  OMNIPAQUE IOHEXOL 300 MG/ML  SOLN COMPARISON:  CT abdomen and pelvis 04/05/2023 FINDINGS: Lower chest: No acute abnormality. Hepatobiliary: Hepatic steatosis and cirrhosis. Cholecystectomy. No biliary dilation. Pancreas/spleen: The enhancing lesion in the splenic hilum/tail of the pancreas areas seen previously is now more ill-defined and heterogenous. The central cystic component is no longer visualized. Exact measurement is difficult given silhouetting from adjacent hemorrhage but measures approximately 4.6 x 4.1 cm. There is moderate adjacent hyperdense fluid suspicious for hemorrhage. No evidence of active bleeding. The fluid tracks around the spleen and inferior to the greater curvature of the stomach. Additional fluid in the pelvis. No free intraperitoneal air. The pancreas and spleen are otherwise unremarkable. Adrenals/Urinary Tract: Normal adrenal glands. No urinary calculi or hydronephrosis. Bladder is unremarkable. Stomach/Bowel: Normal caliber large and small bowel. Colonic diverticulosis without diverticulitis. No bowel wall thickening. Stomach is within  normal limits. Vascular/Lymphatic: No significant vascular findings are present. No enlarged abdominal or pelvic lymph nodes. Reproductive: Hysterectomy. Other: No abdominal wall hernia. Musculoskeletal: No acute fracture. IMPRESSION: 1. Moderate hyperdense fluid in the upper abdomen suspicious for hemorrhage. The enhancing lesion in the splenic hilum/tail of the pancreas is now more ill-defined and heterogenous and is favored to represent the source of hemorrhage. 2. Hepatic steatosis and cirrhosis. 3. Colonic diverticulosis without diverticulitis. Electronically Signed: By: Minerva Fester M.D. On: 04/14/2023 21:13   DG Chest 2 View  Result Date: 04/14/2023 CLINICAL DATA:  Postprocedural pain. Colonoscopy today. Abdominal pain and  nausea. EXAM: CHEST - 2 VIEW COMPARISON:  06/29/2022 FINDINGS: Stable cardiomediastinal silhouette. Aortic atherosclerotic calcification. Left basilar atelectasis/scarring. Otherwise no focal consolidation, pleural effusion, or pneumothorax. No displaced rib fractures. IMPRESSION: No acute cardiopulmonary disease. Electronically Signed   By: Minerva Fester M.D.   On: 04/14/2023 20:34   CT ABDOMEN PELVIS W CONTRAST  Result Date: 04/14/2023 CLINICAL DATA:  Left-sided abdominal pain. History of cirrhosis. HCC screening. EXAM: CT ABDOMEN AND PELVIS WITH CONTRAST TECHNIQUE: Multidetector CT imaging of the abdomen and pelvis was performed using the standard protocol following bolus administration of intravenous contrast. RADIATION DOSE REDUCTION: This exam was performed according to the departmental dose-optimization program which includes automated exposure control, adjustment of the mA and/or kV according to patient size and/or use of iterative reconstruction technique. CONTRAST:  OMNIPAQUE IOHEXOL 300 MG/ML  SOLN COMPARISON:  MRI abdomen 01/27/2022 FINDINGS: Lower chest: No acute abnormality. Hepatobiliary: Cirrhotic morphology of the liver including hypertrophy of the caudate lobe and lateral segment of left hepatic lobe, widening of the falciform ligament and diffuse nodular contour. No enhancing liver lesions identified. Status post cholecystectomy. No significant bile duct dilatation. Pancreas: Enhancing lesion with central low-density arising off the tail of pancreas measures 4.0 x 2.8 cm, image 28/2. This is stable when compared with the previous exam. There is no main duct dilatation or inflammation identified. Spleen: The spleen measures 12.6 by 15.1 x 8.6 cm (volume = 860 cm^3). Adrenals/Urinary Tract: Normal appearance of the adrenal glands. No kidney mass, no nephrolithiasis, hydronephrosis or mass. Urinary bladder appears normal. Stomach/Bowel: Stomach is within normal limits. There is no  pathologic dilatation of the large or small bowel loops. No bowel wall thickening or inflammatory changes identified. Colonic diverticulosis is identified, most severe at the level of the sigmoid colon. No bowel wall thickening or inflammatory change. Vascular/Lymphatic: Aortic atherosclerosis. No aneurysm. The abdominal vascularity is patent. Esophageal and splenic varices. No signs of abdominopelvic adenopathy. Reproductive: Status post hysterectomy. No adnexal masses. Other: No free fluid or fluid collections. No signs of pneumoperitoneum. Musculoskeletal: Spondylosis identified within the lumbar spine. No acute or suspicious osseous findings. IMPRESSION: 1. No acute findings within the abdomen or pelvis. 2. Cirrhotic morphology of the liver with signs of portal hypertension including splenomegaly and esophageal and splenic varices. No enhancing liver lesions identified. Note: There is diminished sensitivity for hepatoma screening with portal venous and delayed phase imaging only. Given the hypervascular nature of hepatomas, the study of choice for [facility] screening would be multiphase liver protocol MRI or CT (which includes arterial phase imaging) 3. Stable enhancing lesion arising off the tail of pancreas. This is been unchanged dating back to 2014 and is favored to represent a benign etiology. 4. Colonic diverticulosis without signs of acute diverticulitis. 5.  Aortic Atherosclerosis (ICD10-I70.0). Electronically Signed   By: Signa Kell M.D.   On: 04/14/2023 11:32    Microbiology:  Results for orders placed or performed during the hospital encounter of 04/15/19  Urine culture     Status: Abnormal   Collection Time: 04/15/19  3:24 PM   Specimen: Urine, Random  Result Value Ref Range Status   Specimen Description URINE, RANDOM  Final   Special Requests   Final    NONE Performed at [facility], [address], [place], [place] [address]    Culture 20,000 COLONIES/mL [facility] MORGANII (A)   Final   Report Status 04/17/2019 FINAL  Final   Organism ID, Bacteria MORGANELLA MORGANII (A)  Final      Susceptibility   Morganella morganii - MIC*    AMPICILLIN >=32 RESISTANT Resistant     CEFAZOLIN >=64 RESISTANT Resistant     CEFTRIAXONE <=1 SENSITIVE Sensitive     CIPROFLOXACIN <=0.25 SENSITIVE Sensitive     GENTAMICIN <=1 SENSITIVE Sensitive     IMIPENEM 1 SENSITIVE Sensitive     NITROFURANTOIN 128 RESISTANT Resistant     TRIMETH/SULFA <=20 SENSITIVE Sensitive     AMPICILLIN/SULBACTAM 4 SENSITIVE Sensitive     PIP/TAZO <=4 SENSITIVE Sensitive     * 20,000 COLONIES/mL MORGANELLA MORGANII    Labs: CBC: Recent Labs  Lab 04/14/23 1638 04/15/23 0107 04/15/23 0415 04/15/23 1038 04/16/23 0512 04/16/23 1240  WBC 7.9  --  8.2  --  5.6 6.8  NEUTROABS 6.1  --  6.9  --   --   --   HGB 11.8* 11.3* 10.7* 10.6* 10.0* 10.1*  HCT 34.9* 33.5* 32.6*  --  31.4* 30.1*  MCV 94.8  --  96.4  --  99.1 96.5  PLT 143*  --  126*  --  107* 112*   Basic Metabolic Panel: Recent Labs  Lab 04/14/23 1638 04/14/23 2159 04/15/23 0415 04/16/23 0512  NA 137  --  137 136  K 3.1*  --  4.2 3.6  CL 102  --  104 102  CO2 25  --  26 28  GLUCOSE 159*  --  142* 112*  BUN 13  --  15 15  CREATININE 0.43*  --  0.41* 0.67  CALCIUM 8.8*  --  8.8* 8.5*  MG  --  1.9 2.0  --    Liver Function Tests: Recent Labs  Lab 04/14/23 1638 04/15/23 0415  AST 59* 56*  ALT 40 43  ALKPHOS 74 73  BILITOT 1.7* 1.6*  PROT 7.0 6.8  ALBUMIN 3.7 3.5   CBG: No results for input(s): "GLUCAP" in the last 168 hours.  Discharge time spent: approximately 35 minutes spent on discharge counseling, evaluation of patient on day of discharge, and coordination of discharge planning with nursing, social work, pharmacy and case management  Signed: Alberteen Sam, MD [facility] 04/16/2023

## 2023-04-16 NOTE — Progress Notes (Signed)
Gastroenterology Inpatient Follow-up Note   PATIENT IDENTIFICATION  Claudia Weber is a 72 y.o. female Hospital Day: 3  SUBJECTIVE  The patient's chart has been reviewed. The patient's labs have been reviewed.  Hemoglobin has been stable. Patient has had a bowel movement overnight into this morning. She is seen with her husband at bedside today. She is much more awake and conversant than yesterday when I had seen her in the afternoon. Pain is still present but overall improved since Friday. Rated as a 4 out of 10. She is getting up and using the restroom. She denies any fevers or chills. She wants to advance her diet if possible.   OBJECTIVE  Scheduled Inpatient Medications:   levothyroxine  50 mcg Oral QHS   Continuous Inpatient Infusions:   promethazine (PHENERGAN) injection (IM or IVPB)     PRN Inpatient Medications: acetaminophen **OR** acetaminophen, naLOXone (NARCAN)  injection, oxyCODONE, promethazine (PHENERGAN) injection (IM or IVPB)   Physical Examination  Temp:  [97.4 F (36.3 C)-98 F (36.7 C)] 98 F (36.7 C) (11/16 0550) Pulse Rate:  [71-85] 72 (11/16 0550) Resp:  [16-18] 17 (11/16 0550) BP: (92-153)/(57-76) 153/76 (11/16 0550) SpO2:  [96 %-100 %] 98 % (11/16 0550) Weight:  [122.5 kg] 122.5 kg (11/15 1833) Temp (24hrs), Avg:97.8 F (36.6 C), Min:97.4 F (36.3 C), Max:98 F (36.7 C)  Weight: 122.5 kg GEN: NAD, appears stated age, doesn't appear chronically ill, husband at bedside PSYCH: Cooperative, without pressured speech EYE: Conjunctivae pink, sclerae anicteric ENT: MMM CV: Nontachycardic RESP: No audible wheezing GI: NABS, soft, protuberant abdomen, rounded, TTP in the left upper quadrant upon deep palpation, no rebound or volitional guarding today MSK/EXT: Bilateral pedal edema present SKIN: No jaundice NEURO:  Alert & Oriented x 3, no focal deficits   Review of Data   Laboratory Studies   Recent Labs  Lab 04/15/23 0415 04/16/23 0512   NA 137 136  K 4.2 3.6  CL 104 102  CO2 26 28  BUN 15 15  CREATININE 0.41* 0.67  GLUCOSE 142* 112*  CALCIUM 8.8* 8.5*  MG 2.0  --    Recent Labs  Lab 04/15/23 0415  AST 56*  ALT 43  ALKPHOS 73    Recent Labs  Lab 04/14/23 1638 04/15/23 0107 04/15/23 0415 04/15/23 1038 04/16/23 0512  WBC 7.9  --  8.2  --  5.6  HGB 11.8*   < > 10.7*   < > 10.0*  HCT 34.9*   < > 32.6*  --  31.4*  PLT 143*  --  126*  --  107*   < > = values in this interval not displayed.   Recent Labs  Lab 04/14/23 2129 04/14/23 2159 04/15/23 0415  APTT  --  26  --   INR 1.1  --  1.1   Imaging Studies  CT ABDOMEN PELVIS W CONTRAST  Addendum Date: 04/14/2023   ADDENDUM REPORT: 04/14/2023 21:16 ADDENDUM: These results were called by telephone at the time of interpretation on 04/14/2023 at 9:15 pm to provider JOSHUA LONG , who verbally acknowledged these results. Electronically Signed   By: Minerva Fester M.D.   On: 04/14/2023 21:16   Result Date: 04/14/2023 CLINICAL DATA:  Post colonoscopy abdominal pain. Bilateral upper abdominal pain and nausea since the procedure. EXAM: CT ABDOMEN AND PELVIS WITH CONTRAST TECHNIQUE: Multidetector CT imaging of the abdomen and pelvis was performed using the standard protocol following bolus administration of intravenous contrast. RADIATION DOSE REDUCTION: This exam was  performed according to the departmental dose-optimization program which includes automated exposure control, adjustment of the mA and/or kV according to patient size and/or use of iterative reconstruction technique. CONTRAST:  OMNIPAQUE IOHEXOL 300 MG/ML  SOLN COMPARISON:  CT abdomen and pelvis 04/05/2023 FINDINGS: Lower chest: No acute abnormality. Hepatobiliary: Hepatic steatosis and cirrhosis. Cholecystectomy. No biliary dilation. Pancreas/spleen: The enhancing lesion in the splenic hilum/tail of the pancreas areas seen previously is now more ill-defined and heterogenous. The central cystic  component is no longer visualized. Exact measurement is difficult given silhouetting from adjacent hemorrhage but measures approximately 4.6 x 4.1 cm. There is moderate adjacent hyperdense fluid suspicious for hemorrhage. No evidence of active bleeding. The fluid tracks around the spleen and inferior to the greater curvature of the stomach. Additional fluid in the pelvis. No free intraperitoneal air. The pancreas and spleen are otherwise unremarkable. Adrenals/Urinary Tract: Normal adrenal glands. No urinary calculi or hydronephrosis. Bladder is unremarkable. Stomach/Bowel: Normal caliber large and small bowel. Colonic diverticulosis without diverticulitis. No bowel wall thickening. Stomach is within normal limits. Vascular/Lymphatic: No significant vascular findings are present. No enlarged abdominal or pelvic lymph nodes. Reproductive: Hysterectomy. Other: No abdominal wall hernia. Musculoskeletal: No acute fracture. IMPRESSION: 1. Moderate hyperdense fluid in the upper abdomen suspicious for hemorrhage. The enhancing lesion in the splenic hilum/tail of the pancreas is now more ill-defined and heterogenous and is favored to represent the source of hemorrhage. 2. Hepatic steatosis and cirrhosis. 3. Colonic diverticulosis without diverticulitis. Electronically Signed: By: Minerva Fester M.D. On: 04/14/2023 21:13   DG Chest 2 View  Result Date: 04/14/2023 CLINICAL DATA:  Postprocedural pain. Colonoscopy today. Abdominal pain and nausea. EXAM: CHEST - 2 VIEW COMPARISON:  06/29/2022 FINDINGS: Stable cardiomediastinal silhouette. Aortic atherosclerotic calcification. Left basilar atelectasis/scarring. Otherwise no focal consolidation, pleural effusion, or pneumothorax. No displaced rib fractures. IMPRESSION: No acute cardiopulmonary disease. Electronically Signed   By: Minerva Fester M.D.   On: 04/14/2023 20:34    GI Procedures and Studies  No new GI studies to review   ASSESSMENT  [name] is a 72 y.o.  female with a pmh significant for compensated NASH cirrhosis, hypertension, hypothyroidism, diverticulosis (prior episode of diverticulitis), colon polyps (TA's), pancreatic lesion.  Patient admitted post colonoscopy/endoscopy with abdominal pain found to have hemoperitoneum with pancreatic tail lesion potentially source of bleeding rather than splenic laceration.  The patient is hemodynamically and clinically stable at this time.  She seems to actually be better than yesterday when I seen her in the afternoon which is good news.  She wants to advance her diet and I think that is reasonable.  Her hemogram has been stable.  I think if her hemogram stays very stable over the course of today and she is tolerated advancing her diet, and we have a good pain regimen set up for her, it makes sense that she could be discharged later today versus tomorrow.  She will likely have some abdominal discomfort for a few weeks as the hemoperitoneum reabsorbs.  This pancreatic lesion has been benign for years dating back to 2014, but now looks to have had been a potential source of bleeding.  I think it makes sense for this to be reevaluated with a pancreas protocol CT abdomen I would advocate that this be performed in the next 2 to 4 weeks and I will relay this to patient's primary gastroenterologist to consider this.  We will work on setting up follow-up with him as well.  If the patient is still  here tomorrow, we will see her in follow-up.  Patient and husband agree with this plan of action.  Case discussed with medicine service as well.   PLAN/RECOMMENDATIONS  Advance diet as tolerated Pain medication as per medical service - Expect that she will have pain for up to the next couple weeks We can set up a repeat CBC this week in our office and I will send this to our team Consider pancreas protocol CT abdomen in 2 to 4 weeks. Will set up follow-up with Dr. Russella Dar as well   Please page/call with questions or  concerns.   Corliss Parish, MD [facility] Office # [phone]    LOS: 2 days  Lemar Lofty  04/16/2023, 7:25 AM

## 2023-04-16 NOTE — Progress Notes (Signed)
Subjective/Chief Complaint: No n/v, had bm this am, still sore but better   Objective: Vital signs in last 24 hours: Temp:  [97.4 F (36.3 C)-98 F (36.7 C)] 98 F (36.7 C) (11/16 0550) Pulse Rate:  [71-85] 72 (11/16 0550) Resp:  [17-18] 17 (11/16 0550) BP: (92-153)/(57-76) 153/76 (11/16 0550) SpO2:  [96 %-99 %] 98 % (11/16 0550) Weight:  [122.5 kg] 122.5 kg (11/15 1833) Last BM Date : 04/14/23  Intake/Output from previous day: No intake/output data recorded. Intake/Output this shift: No intake/output data recorded.  Abd soft mild tender epigastrium nondistended  Lab Results:  Recent Labs    04/15/23 0415 04/15/23 1038 04/16/23 0512  WBC 8.2  --  5.6  HGB 10.7* 10.6* 10.0*  HCT 32.6*  --  31.4*  PLT 126*  --  107*   BMET Recent Labs    04/15/23 0415 04/16/23 0512  NA 137 136  K 4.2 3.6  CL 104 102  CO2 26 28  GLUCOSE 142* 112*  BUN 15 15  CREATININE 0.41* 0.67  CALCIUM 8.8* 8.5*   PT/INR Recent Labs    04/14/23 2129 04/15/23 0415  LABPROT 14.9 14.6  INR 1.1 1.1   ABG No results for input(s): "PHART", "HCO3" in the last 72 hours.  Invalid input(s): "PCO2", "PO2"  Studies/Results: CT ABDOMEN PELVIS W CONTRAST  Addendum Date: 04/14/2023   ADDENDUM REPORT: 04/14/2023 21:16 ADDENDUM: These results were called by telephone at the time of interpretation on 04/14/2023 at 9:15 pm to provider JOSHUA LONG , who verbally acknowledged these results. Electronically Signed   By: Minerva Fester M.D.   On: 04/14/2023 21:16   Result Date: 04/14/2023 CLINICAL DATA:  Post colonoscopy abdominal pain. Bilateral upper abdominal pain and nausea since the procedure. EXAM: CT ABDOMEN AND PELVIS WITH CONTRAST TECHNIQUE: Multidetector CT imaging of the abdomen and pelvis was performed using the standard protocol following bolus administration of intravenous contrast. RADIATION DOSE REDUCTION: This exam was performed according to the departmental dose-optimization  program which includes automated exposure control, adjustment of the mA and/or kV according to patient size and/or use of iterative reconstruction technique. CONTRAST:  OMNIPAQUE IOHEXOL 300 MG/ML  SOLN COMPARISON:  CT abdomen and pelvis 04/05/2023 FINDINGS: Lower chest: No acute abnormality. Hepatobiliary: Hepatic steatosis and cirrhosis. Cholecystectomy. No biliary dilation. Pancreas/spleen: The enhancing lesion in the splenic hilum/tail of the pancreas areas seen previously is now more ill-defined and heterogenous. The central cystic component is no longer visualized. Exact measurement is difficult given silhouetting from adjacent hemorrhage but measures approximately 4.6 x 4.1 cm. There is moderate adjacent hyperdense fluid suspicious for hemorrhage. No evidence of active bleeding. The fluid tracks around the spleen and inferior to the greater curvature of the stomach. Additional fluid in the pelvis. No free intraperitoneal air. The pancreas and spleen are otherwise unremarkable. Adrenals/Urinary Tract: Normal adrenal glands. No urinary calculi or hydronephrosis. Bladder is unremarkable. Stomach/Bowel: Normal caliber large and small bowel. Colonic diverticulosis without diverticulitis. No bowel wall thickening. Stomach is within normal limits. Vascular/Lymphatic: No significant vascular findings are present. No enlarged abdominal or pelvic lymph nodes. Reproductive: Hysterectomy. Other: No abdominal wall hernia. Musculoskeletal: No acute fracture. IMPRESSION: 1. Moderate hyperdense fluid in the upper abdomen suspicious for hemorrhage. The enhancing lesion in the splenic hilum/tail of the pancreas is now more ill-defined and heterogenous and is favored to represent the source of hemorrhage. 2. Hepatic steatosis and cirrhosis. 3. Colonic diverticulosis without diverticulitis. Electronically Signed: By: Minerva Fester M.D. On: 04/14/2023  21:13   DG Chest 2 View  Result Date: 04/14/2023 CLINICAL DATA:   Postprocedural pain. Colonoscopy today. Abdominal pain and nausea. EXAM: CHEST - 2 VIEW COMPARISON:  06/29/2022 FINDINGS: Stable cardiomediastinal silhouette. Aortic atherosclerotic calcification. Left basilar atelectasis/scarring. Otherwise no focal consolidation, pleural effusion, or pneumothorax. No displaced rib fractures. IMPRESSION: No acute cardiopulmonary disease. Electronically Signed   By: Minerva Fester M.D.   On: 04/14/2023 20:34    Anti-infectives: Anti-infectives (From admission, onward)    None       Assessment/Plan:  Hemoperitoneum s/p colonoscopy and EGD yesterday - CT without active extravasation, no obvious liver or spleen injury, less well-defined known cystic mass in pancreas -hb fairly stable, feels better -think you can advance diet as tolerated - no indication for acute surgical intervention  -will sign off, please call back if needed    - per [facility] -  Cirrhosis of the liver  Hx of uterine cancer s/p complete hysterectomy  HTN Hypothyroidism   Emelia Loron 04/16/2023

## 2023-04-18 ENCOUNTER — Telehealth: Payer: Self-pay

## 2023-04-18 DIAGNOSIS — K7460 Unspecified cirrhosis of liver: Secondary | ICD-10-CM

## 2023-04-18 LAB — URINE CULTURE

## 2023-04-18 NOTE — Telephone Encounter (Signed)
-----   Message from [facility] sent at 04/16/2023 10:42 AM EST ----- Regarding: Follow-up Claudia Weber, Patient will likely be discharged today (Saturday versus Sunday). Please call patient on Monday. Check on how she is doing and update Dr. Russella Dar. Have her come in on Tuesday or Wednesday for repeat CBC to follow-up blood counts. I recommend a repeat CT abdomen pancreas protocol in the next 2 to 4 weeks. Set her up follow-up with Dr. Russella Dar in December. Thanks. GM

## 2023-04-18 NOTE — Telephone Encounter (Signed)
Claudia Dare, MD  Mansouraty, Netty Starring., MD; Loretha Stapler, RN [address] Can use a banding appt slot for her office appt with me if no other appts available. Agree with CBC Tues or Wed. Please schedule repeat CT AP in next 3-4 weeks. MS

## 2023-04-18 NOTE — Telephone Encounter (Signed)
Lab order has been entered for this week CT scan has been ordered and sent to the schedulers for 2-4 weeks from now  Follow up with Dr Russella Dar on 05/03/23 at 1050 am.  The pt has been advised and all questions answered to the best of my ability.

## 2023-04-19 LAB — SURGICAL PATHOLOGY

## 2023-04-20 ENCOUNTER — Other Ambulatory Visit (INDEPENDENT_AMBULATORY_CARE_PROVIDER_SITE_OTHER): Payer: Medicare HMO

## 2023-04-20 DIAGNOSIS — K7460 Unspecified cirrhosis of liver: Secondary | ICD-10-CM

## 2023-04-20 LAB — CBC WITH DIFFERENTIAL/PLATELET
Basophils Absolute: 0 10*3/uL (ref 0.0–0.1)
Basophils Relative: 1.1 % (ref 0.0–3.0)
Eosinophils Absolute: 0.2 10*3/uL (ref 0.0–0.7)
Eosinophils Relative: 3.9 % (ref 0.0–5.0)
HCT: 36.9 % (ref 36.0–46.0)
Hemoglobin: 12.4 g/dL (ref 12.0–15.0)
Lymphocytes Relative: 22.5 % (ref 12.0–46.0)
Lymphs Abs: 0.9 10*3/uL (ref 0.7–4.0)
MCHC: 33.5 g/dL (ref 30.0–36.0)
MCV: 95.1 fL (ref 78.0–100.0)
Monocytes Absolute: 0.6 10*3/uL (ref 0.1–1.0)
Monocytes Relative: 15.2 % — ABNORMAL HIGH (ref 3.0–12.0)
Neutro Abs: 2.3 10*3/uL (ref 1.4–7.7)
Neutrophils Relative %: 57.3 % (ref 43.0–77.0)
Platelets: 141 10*3/uL — ABNORMAL LOW (ref 150.0–400.0)
RBC: 3.88 Mil/uL (ref 3.87–5.11)
RDW: 13.7 % (ref 11.5–15.5)
WBC: 4 10*3/uL (ref 4.0–10.5)

## 2023-04-25 NOTE — Telephone Encounter (Signed)
The pt has been advised that the CT will need to be done with contrast to give Dr Russella Dar the information he needs from the testing.  She has agreed to continue with the CT as ordered.

## 2023-04-25 NOTE — Telephone Encounter (Signed)
Inbound call from patient, states she does not wish to drink contrast, would only like IV contrast, calling to follow up on order. And also scheduling.

## 2023-04-26 ENCOUNTER — Encounter: Payer: Self-pay | Admitting: Gastroenterology

## 2023-05-03 ENCOUNTER — Ambulatory Visit: Payer: Medicare HMO | Admitting: Gastroenterology

## 2023-05-03 ENCOUNTER — Encounter: Payer: Self-pay | Admitting: Gastroenterology

## 2023-05-03 ENCOUNTER — Other Ambulatory Visit (INDEPENDENT_AMBULATORY_CARE_PROVIDER_SITE_OTHER): Payer: Medicare HMO

## 2023-05-03 VITALS — BP 122/70 | HR 77 | Ht 66.0 in | Wt 270.0 lb

## 2023-05-03 DIAGNOSIS — K7460 Unspecified cirrhosis of liver: Secondary | ICD-10-CM | POA: Diagnosis not present

## 2023-05-03 DIAGNOSIS — D696 Thrombocytopenia, unspecified: Secondary | ICD-10-CM

## 2023-05-03 DIAGNOSIS — Z860100 Personal history of colon polyps, unspecified: Secondary | ICD-10-CM

## 2023-05-03 DIAGNOSIS — D62 Acute posthemorrhagic anemia: Secondary | ICD-10-CM

## 2023-05-03 DIAGNOSIS — R161 Splenomegaly, not elsewhere classified: Secondary | ICD-10-CM | POA: Diagnosis not present

## 2023-05-03 DIAGNOSIS — Z8719 Personal history of other diseases of the digestive system: Secondary | ICD-10-CM

## 2023-05-03 DIAGNOSIS — K7581 Nonalcoholic steatohepatitis (NASH): Secondary | ICD-10-CM | POA: Diagnosis not present

## 2023-05-03 DIAGNOSIS — Z860101 Personal history of adenomatous and serrated colon polyps: Secondary | ICD-10-CM

## 2023-05-03 LAB — CBC WITH DIFFERENTIAL/PLATELET
Basophils Absolute: 0 10*3/uL (ref 0.0–0.1)
Basophils Relative: 0.8 % (ref 0.0–3.0)
Eosinophils Absolute: 0.1 10*3/uL (ref 0.0–0.7)
Eosinophils Relative: 2 % (ref 0.0–5.0)
HCT: 39 % (ref 36.0–46.0)
Hemoglobin: 13 g/dL (ref 12.0–15.0)
Lymphocytes Relative: 22.7 % (ref 12.0–46.0)
Lymphs Abs: 1.1 10*3/uL (ref 0.7–4.0)
MCHC: 33.3 g/dL (ref 30.0–36.0)
MCV: 95.7 fL (ref 78.0–100.0)
Monocytes Absolute: 0.6 10*3/uL (ref 0.1–1.0)
Monocytes Relative: 11.7 % (ref 3.0–12.0)
Neutro Abs: 3.1 10*3/uL (ref 1.4–7.7)
Neutrophils Relative %: 62.8 % (ref 43.0–77.0)
Platelets: 144 10*3/uL — ABNORMAL LOW (ref 150.0–400.0)
RBC: 4.07 Mil/uL (ref 3.87–5.11)
RDW: 14.2 % (ref 11.5–15.5)
WBC: 5 10*3/uL (ref 4.0–10.5)

## 2023-05-03 LAB — BASIC METABOLIC PANEL
BUN: 17 mg/dL (ref 6–23)
CO2: 32 meq/L (ref 19–32)
Calcium: 9.8 mg/dL (ref 8.4–10.5)
Chloride: 104 meq/L (ref 96–112)
Creatinine, Ser: 0.66 mg/dL (ref 0.40–1.20)
GFR: 88.64 mL/min (ref >60.00)
Glucose, Bld: 107 mg/dL — ABNORMAL HIGH (ref 70–99)
Potassium: 4.3 meq/L (ref 3.5–5.1)
Sodium: 141 meq/L (ref 135–145)

## 2023-05-03 NOTE — Patient Instructions (Signed)
Your provider has requested that you go to the basement level for lab work before leaving today. Press "B" on the elevator. The lab is located at the first door on the left as you exit the elevator.  You have been scheduled for a CT scan of the abdomen and pelvis at [facility], 1st floor Radiology. You are scheduled on 05/16/23 at 3:00pm arrive at 12:45pm.  Please follow the written instructions below on the day of your exam:   1) Do not eat anything after 11:00am (4 hours prior to your test)    The purpose of you drinking the oral contrast is to aid in the visualization of your intestinal tract. The contrast solution may cause some diarrhea. Depending on your individual set of symptoms, you may also receive an intravenous injection of x-ray contrast/dye. Plan on being at [facility] for 45 minutes or longer, depending on the type of exam you are having performed.   If you have any questions regarding your exam or if you need to reschedule, you may call [facility] at [phone] between the hours of 8:00 am and 5:00 pm, Monday-Friday.   The [facility] providers would like to encourage you to use [facility] to communicate with providers for non-urgent requests or questions.  Due to long hold times on the telephone, sending your provider a message by [facility] may be a faster and more efficient way to get a response.  Please allow 48 business hours for a response.  Please remember that this is for non-urgent requests.   Due to recent changes in healthcare laws, you may see the results of your imaging and laboratory studies on MyChart before your provider has had a chance to review them.  We understand that in some cases there may be results that are confusing or concerning to you. Not all laboratory results come back in the same time frame and the provider may be waiting for multiple results in order to interpret others.  Please give [place] 48 hours in order for your provider to thoroughly  review all the results before contacting the office for clarification of your results.   Thank you for choosing me and [facility].  Venita Lick. Pleas Koch., MD., Clementeen Graham

## 2023-05-03 NOTE — Progress Notes (Signed)
Assessment     Hemoperitoneum post colonoscopy, resolved, d/t ruptured benign pancreatic tail cyst or splenule vs small vessel bleeding  Acute blood loss anemia, resolving Compensated MASH cirrhosis, splenomegaly, thrombocytopenia Personal history of adenomatous and sessile serrated colon polyps S/P lap hysterectomy, BSO for endometrial cancer  History of recurrent diverticulitis    Recommendations    CBC, BMET Schedule CT AP - follow up hemoperitoneum and splenic hilum/pancreatic tail lesion  No plans for future surveillance colonoscopy given age, comorbidities and recent complication REV in 6 months    HPI    This is a 72 year old female who returns following hemoperitoneum post colonoscopy on Nov 14.  She is accompanied by her husband.  Intraperitoneal bleeding from pancreatic cyst / splenule vs small vessel bleeding felt to be the source.  Her bleeding stopped spontaneously and she did not require transfusion.  She was discharged after a 2-day hospital stay.  Follow-up CBC several days later revealed a hemoglobin increase from a nadir of 10.0 to 12.4.  She has felt well since discharge.  Her abdominal pain has completely resolved.  She is eating normally.  Her constipation is adequately managed on a high-fiber diet.   Labs / Imaging       Latest Ref Rng & Units 04/15/2023    4:15 AM 04/14/2023    4:38 PM 03/30/2023    9:13 AM  Hepatic Function  Total Protein 6.5 - 8.1 g/dL 6.8  7.0  7.7   Albumin 3.5 - 5.0 g/dL 3.5  3.7  4.1   AST 15 - 41 U/L 56  59  48   ALT 0 - 44 U/L 43  40  37   Alk Phosphatase 38 - 126 U/L 73  74  87   Total Bilirubin <1.2 mg/dL 1.6  1.7  1.0        Latest Ref Rng & Units 04/20/2023    9:21 AM 04/16/2023   12:40 PM 04/16/2023    5:12 AM  CBC  WBC 4.0 - 10.5 K/uL 4.0  6.8  5.6   Hemoglobin 12.0 - 15.0 g/dL 53.6  64.4  03.4   Hematocrit 36.0 - 46.0 % 36.9  30.1  31.4   Platelets 150.0 - 400.0 K/uL 141.0  112  107      CT ABDOMEN PELVIS  W CONTRAST Addendum: ADDENDUM REPORT: 04/14/2023 21:16   ADDENDUM:  These results were called by telephone at the time of interpretation  on 04/14/2023 at 9:15 pm to provider JOSHUA LONG , who verbally  acknowledged these results.   Electronically Signed    By: Minerva Fester M.D.    On: 04/14/2023 21:16 Narrative: CLINICAL DATA:  Post colonoscopy abdominal pain. Bilateral upper abdominal pain and nausea since the procedure.  EXAM: CT ABDOMEN AND PELVIS WITH CONTRAST  TECHNIQUE: Multidetector CT imaging of the abdomen and pelvis was performed using the standard protocol following bolus administration of intravenous contrast.  RADIATION DOSE REDUCTION: This exam was performed according to the departmental dose-optimization program which includes automated exposure control, adjustment of the mA and/or kV according to patient size and/or use of iterative reconstruction technique.  CONTRAST:  OMNIPAQUE IOHEXOL 300 MG/ML  SOLN  COMPARISON:  CT abdomen and pelvis 04/05/2023  FINDINGS: Lower chest: No acute abnormality.  Hepatobiliary: Hepatic steatosis and cirrhosis. Cholecystectomy. No biliary dilation.  Pancreas/spleen: The enhancing lesion in the splenic hilum/tail of the pancreas areas seen previously is now more ill-defined and heterogenous. The central cystic  component is no longer visualized. Exact measurement is difficult given silhouetting from adjacent hemorrhage but measures approximately 4.6 x 4.1 cm. There is moderate adjacent hyperdense fluid suspicious for hemorrhage. No evidence of active bleeding. The fluid tracks around the spleen and inferior to the greater curvature of the stomach. Additional fluid in the pelvis. No free intraperitoneal air.  The pancreas and spleen are otherwise unremarkable.  Adrenals/Urinary Tract: Normal adrenal glands. No urinary calculi or hydronephrosis. Bladder is unremarkable.  Stomach/Bowel: Normal caliber large and  small bowel. Colonic diverticulosis without diverticulitis. No bowel wall thickening. Stomach is within normal limits.  Vascular/Lymphatic: No significant vascular findings are present. No enlarged abdominal or pelvic lymph nodes.  Reproductive: Hysterectomy.  Other: No abdominal wall hernia.  Musculoskeletal: No acute fracture.  IMPRESSION: 1. Moderate hyperdense fluid in the upper abdomen suspicious for hemorrhage. The enhancing lesion in the splenic hilum/tail of the pancreas is now more ill-defined and heterogenous and is favored to represent the source of hemorrhage. 2. Hepatic steatosis and cirrhosis. 3. Colonic diverticulosis without diverticulitis.  Electronically Signed: By: Minerva Fester M.D. On: 04/14/2023 21:13 DG Chest 2 View CLINICAL DATA:  Postprocedural pain. Colonoscopy today. Abdominal pain and nausea.  EXAM: CHEST - 2 VIEW  COMPARISON:  06/29/2022  FINDINGS: Stable cardiomediastinal silhouette. Aortic atherosclerotic calcification. Left basilar atelectasis/scarring. Otherwise no focal consolidation, pleural effusion, or pneumothorax. No displaced rib fractures.  IMPRESSION: No acute cardiopulmonary disease.  Electronically Signed   By: Minerva Fester M.D.   On: 04/14/2023 20:34 CT ABDOMEN PELVIS W CONTRAST CLINICAL DATA:  Left-sided abdominal pain. History of cirrhosis. HCC screening.  EXAM: CT ABDOMEN AND PELVIS WITH CONTRAST  TECHNIQUE: Multidetector CT imaging of the abdomen and pelvis was performed using the standard protocol following bolus administration of intravenous contrast.  RADIATION DOSE REDUCTION: This exam was performed according to the departmental dose-optimization program which includes automated exposure control, adjustment of the mA and/or kV according to patient size and/or use of iterative reconstruction technique.  CONTRAST:  OMNIPAQUE IOHEXOL 300 MG/ML  SOLN  COMPARISON:  MRI abdomen  01/27/2022  FINDINGS: Lower chest: No acute abnormality.  Hepatobiliary: Cirrhotic morphology of the liver including hypertrophy of the caudate lobe and lateral segment of left hepatic lobe, widening of the falciform ligament and diffuse nodular contour. No enhancing liver lesions identified. Status post cholecystectomy. No significant bile duct dilatation.  Pancreas: Enhancing lesion with central low-density arising off the tail of pancreas measures 4.0 x 2.8 cm, image 28/2. This is stable when compared with the previous exam. There is no main duct dilatation or inflammation identified.  Spleen: The spleen measures 12.6 by 15.1 x 8.6 cm (volume = 860 cm^3).  Adrenals/Urinary Tract: Normal appearance of the adrenal glands. No kidney mass, no nephrolithiasis, hydronephrosis or mass. Urinary bladder appears normal.  Stomach/Bowel: Stomach is within normal limits. There is no pathologic dilatation of the large or small bowel loops. No bowel wall thickening or inflammatory changes identified. Colonic diverticulosis is identified, most severe at the level of the sigmoid colon. No bowel wall thickening or inflammatory change.  Vascular/Lymphatic: Aortic atherosclerosis. No aneurysm. The abdominal vascularity is patent. Esophageal and splenic varices. No signs of abdominopelvic adenopathy.  Reproductive: Status post hysterectomy. No adnexal masses.  Other: No free fluid or fluid collections. No signs of pneumoperitoneum.  Musculoskeletal: Spondylosis identified within the lumbar spine. No acute or suspicious osseous findings.  IMPRESSION: 1. No acute findings within the abdomen or pelvis. 2. Cirrhotic morphology of the liver with  signs of portal hypertension including splenomegaly and esophageal and splenic varices. No enhancing liver lesions identified. Note: There is diminished sensitivity for hepatoma screening with portal venous and delayed phase imaging only. Given the  hypervascular nature of hepatomas, the study of choice for [facility] screening would be multiphase liver protocol MRI or CT (which includes arterial phase imaging) 3. Stable enhancing lesion arising off the tail of pancreas. This is been unchanged dating back to 2014 and is favored to represent a benign etiology. 4. Colonic diverticulosis without signs of acute diverticulitis. 5.  Aortic Atherosclerosis (ICD10-I70.0).  Electronically Signed   By: Signa Kell M.D.   On: 04/14/2023 11:32   Current Medications, Allergies, Past Medical History, Past Surgical History, Family History and Social History were reviewed in [employer] record.   Physical Exam: General: Well developed, well nourished, no acute distress Head: Normocephalic and atraumatic Eyes: Sclerae anicteric, EOMI Ears: Normal auditory acuity Mouth: No deformities or lesions noted Lungs: Clear throughout to auscultation Heart: Regular rate and rhythm; No murmurs, rubs or bruits Abdomen: Soft, non tender and non distended. No masses, hepatosplenomegaly or hernias noted. Normal Bowel sounds Rectal:  Not done Musculoskeletal: Symmetrical with no gross deformities  Pulses:  Normal pulses noted Extremities: No edema or deformities noted Neurological: Alert oriented x 4, grossly nonfocal Psychological:  Alert and cooperative. Normal mood and affect   Pradeep Beaubrun T. Russella Dar, MD 05/03/2023, 11:18 AM

## 2023-05-10 ENCOUNTER — Other Ambulatory Visit: Payer: Self-pay | Admitting: Family

## 2023-05-10 DIAGNOSIS — Z1231 Encounter for screening mammogram for malignant neoplasm of breast: Secondary | ICD-10-CM

## 2023-05-16 ENCOUNTER — Ambulatory Visit (HOSPITAL_COMMUNITY)
Admission: RE | Admit: 2023-05-16 | Discharge: 2023-05-16 | Disposition: A | Discharge status: Home / Self Care | Payer: Medicare HMO | Source: Ambulatory Visit | Attending: Gastroenterology | Admitting: Gastroenterology

## 2023-05-16 DIAGNOSIS — K7460 Unspecified cirrhosis of liver: Secondary | ICD-10-CM | POA: Insufficient documentation

## 2023-05-16 DIAGNOSIS — D62 Acute posthemorrhagic anemia: Secondary | ICD-10-CM | POA: Insufficient documentation

## 2023-05-16 MED ORDER — IOHEXOL 300 MG/ML  SOLN
30.0000 mL | Freq: Once | INTRAMUSCULAR | Status: AC | PRN
  Administered 2023-05-16: 30 mL via ORAL

## 2023-05-16 MED ORDER — IOHEXOL 300 MG/ML  SOLN
100.0000 mL | Freq: Once | INTRAMUSCULAR | Status: AC | PRN
  Administered 2023-05-16: 100 mL via INTRAVENOUS

## 2023-05-30 ENCOUNTER — Telehealth: Payer: Self-pay | Admitting: Gastroenterology

## 2023-05-30 NOTE — Telephone Encounter (Signed)
Patient called in regards to CT scan, would like to know results

## 2023-06-02 NOTE — Telephone Encounter (Signed)
 See results note per Vernia Buff RN. Pt advised

## 2023-07-12 ENCOUNTER — Ambulatory Visit
Admission: RE | Admit: 2023-07-12 | Discharge: 2023-07-12 | Disposition: A | Discharge status: Home / Self Care | Payer: Medicare Other | Source: Ambulatory Visit | Attending: Family | Admitting: Family

## 2023-07-12 DIAGNOSIS — Z1231 Encounter for screening mammogram for malignant neoplasm of breast: Secondary | ICD-10-CM

## 2023-09-28 ENCOUNTER — Other Ambulatory Visit: Payer: Self-pay

## 2023-09-29 ENCOUNTER — Other Ambulatory Visit: Payer: Self-pay

## 2023-09-29 DIAGNOSIS — K7460 Unspecified cirrhosis of liver: Secondary | ICD-10-CM

## 2024-05-01 ENCOUNTER — Telehealth: Payer: Self-pay

## 2024-05-01 NOTE — Telephone Encounter (Signed)
 Pt needs an appt with new provider since Dr Aneita has retired.

## 2024-05-01 NOTE — Telephone Encounter (Signed)
-----   Message from Nurse Naomie RAMAN sent at 05/31/2023 12:39 PM EST ----- Patient needs repeat CT abd/pelvis around 05/14/24; see imaging results from 05/16/23 for additional info; stark pt

## 2024-05-01 NOTE — Telephone Encounter (Signed)
 The pt states that she will call back to make appt after she gets home today.  She thinks she wants to see Dr Suzann.  She will confirm when she gets home

## 2024-05-08 LAB — HEMOGLOBIN A1C: A1c: 6

## 2024-05-08 LAB — MICROALBUMIN / CREATININE URINE RATIO
Albumin, Urine POC: 1.3
Creatinine, POC: 250.13 mg/dL
Microalb Creat Ratio: 5.2

## 2024-05-08 LAB — COMPREHENSIVE METABOLIC PANEL WITH GFR: EGFR: 92.6

## 2024-06-24 NOTE — Progress Notes (Unsigned)
 "  [facility] Return Visit   Referring Provider Silvano Angeline FALCON, NP [address] [place],  [place] [address]  Primary Care Provider Silvano Angeline FALCON, NP  Patient Profile: Claudia Weber is a 74 y.o. female who returns to the [facility] for follow-up of the problem(s) noted below.  Problem List: Compensated MASH cirrhosis, splenomegaly and thrombocytopenia Personal history of adenomatous and sessile serrated colon polyps History of hemoperitoneum post colonoscopy, resolved, secondary to ruptured benign pancreatic tail cyst or splenule versus small vessel bleeding History of recurrent diverticulitis   History of Present Illness   Claudia Weber was last seen in the GI office***   Current GI Meds    Interval History   Discussed the use of AI scribe software for clinical note transcription with the patient, who gave verbal consent to proceed.  History of Present Illness     Last colonoscopy: *** Last endoscopy: ***  Last Abd CT/CTE/MRE: ***  GI Review of Symptoms Significant for {[id number]}. Otherwise negative.  General Review of Systems  Review of systems is significant for the pertinent positives and negatives as listed per the HPI.  Full ROS is otherwise negative.  Past Medical History   Past Medical History:  Diagnosis Date   Cat scratch fever    Diverticulosis    Elevated LFTs    Hepatic cirrhosis (HCC)    High blood pressure    Other specified disorders of thyroid    Uterine cancer [facility])      Past Surgical History   Past Surgical History:  Procedure Laterality Date   ABDOMINAL HYSTERECTOMY  11/2016   BLADDER SURGERY     polyp removed   Breast cyst removal     CHOLECYSTECTOMY     KNEE SURGERY Left    arthroscopic   LIVER BIOPSY     right knee surgery       Allergies and Medications   Allergies[1] @MEDSTODAY @  Family History   Family History  Problem Relation Age of Onset   Throat cancer Mother     Lung cancer Mother    Colitis Mother        resection   Uterine cancer Sister    Hepatitis Brother    Heart disease Maternal Grandmother    Heart disease Maternal Grandfather    Liver cancer Cousin    Leukemia Cousin    Lung cancer Cousin    Colon cancer Cousin    Stomach cancer Neg Hx    Pancreatic cancer Neg Hx    Esophageal cancer Neg Hx    Rectal cancer Neg Hx    GI Specific Family History: {gifamhx:[id number]}   Social History   Social History[2] Claudia Weber reports that she has never smoked. She has never used smokeless tobacco. She reports that she does not currently use alcohol. She reports that she does not use drugs.  Vital Signs and Physical Examination   There were no vitals filed for this visit. There is no height or weight on file to calculate BMI.    General: Well developed, well nourished, no acute distress Head: Normocephalic and atraumatic Eyes: Sclerae anicteric, EOMI Ears: Normal auditory acuity Mouth: No deformities or lesions noted Lungs: Clear throughout to auscultation Heart: Regular rate and rhythm; No murmurs, rubs or bruits Abdomen: Soft, non tender and non distended. No masses, hepatosplenomegaly or hernias noted. Normal Bowel sounds Rectal: Musculoskeletal: Symmetrical with no gross deformities  Pulses:  Normal pulses noted Extremities: No edema or deformities noted Neurological: Alert  oriented x 4, grossly nonfocal Psychological:  Alert and cooperative. Normal mood and affect   Review of Data   The following data was reviewed at the time of this encounter:   Laboratory Studies      Latest Ref Rng & Units 05/03/2023   11:54 AM 04/20/2023    9:21 AM 04/16/2023   12:40 PM  CBC  WBC 4.0 - 10.5 K/uL 5.0  4.0  6.8   Hemoglobin 12.0 - 15.0 g/dL 86.9  87.5  89.8   Hematocrit 36.0 - 46.0 % 39.0  36.9  30.1   Platelets 150.0 - 400.0 K/uL 144.0  141.0  112     Lab Results  Component Value Date   LIPASE 66 (H) 04/14/2023      Latest Ref Rng  & Units 05/03/2023   11:54 AM 04/16/2023    5:12 AM 04/15/2023    4:15 AM  CMP  Glucose 70 - 99 mg/dL 892  887  857   BUN 6 - 23 mg/dL 17  15  15    Creatinine 0.40 - 1.20 mg/dL 9.33  9.32  9.58   Sodium 135 - 145 mEq/L 141  136  137   Potassium 3.5 - 5.1 mEq/L 4.3  3.6  4.2   Chloride 96 - 112 mEq/L 104  102  104   CO2 19 - 32 mEq/L 32  28  26   Calcium 8.4 - 10.5 mg/dL 9.8  8.5  8.8   Total Protein 6.5 - 8.1 g/dL   6.8   Total Bilirubin <1.2 mg/dL   1.6   Alkaline Phos 38 - 126 U/L   73   AST 15 - 41 U/L   56   ALT 0 - 44 U/L   43    Liver biopsy 04/24/2018 Liver, needle/core biopsy - MODERATELY ACTIVE STEATOHEPATITIS (GRADE 2 OF 3; IF DETERMINED TO BE NAFLD, THEN THE NAS = 4 OF 8). - CIRRHOSIS (STAGE 4 OF 4) - SEE NOTE   Diagnosis Note The biopsy is adequate for review and consists of several cores of liver with a distorted architecture with nodule formation. Hepatic nodules show mild micro- and macrovesicular steatosis with moderate ballooning degeneration and a few poorly-formed Mallory hyalines. Rare foci of lobular inflammation are present. Very focal megamitochondria are identified. Portal tracts and fibrous septa show mild to moderate inflammation without significant interface hepatitis. Biliary and vascular structures are unremarkable. Mild to moderate ductular reaction with neutrophilic response is present. Trichrome and reticulin stains highlight cirrhosis. Iron stain shows no abnormal iron accumulation. PASD stain shows no globular inclusions in hepatocytes. The findings are that of moderately active steatohepatitis, alcoholic and/or non-alcoholic, with cirrhosis.  Imaging Studies  CTAP 05/21/2023 1. Upper abdominal fluid has resolved with decreased size of the heterogeneous masslike area along the tail of the pancreas. The lesion itself has been present dating back to 2014 and was largely stable over that interval suggestive of a benign/indolent lesion. However it does  appear somewhat more heterogeneous most likely reflecting hemorrhage within the known lesion. Suggest continued follow-up imaging to ensure stability versus more definitive assessment by direct tissue sampling. 2. Cirrhotic hepatic morphology with splenomegaly. 3. Colonic diverticulosis without findings of acute diverticulitis. 4.  Aortic Atherosclerosis (ICD10-I70.0).  CTAP 04/14/2023 1. Moderate hyperdense fluid in the upper abdomen suspicious for hemorrhage. The enhancing lesion in the splenic hilum/tail of the pancreas is now more ill-defined and heterogenous and is favored to represent the source of hemorrhage. 2. Hepatic steatosis and  cirrhosis. 3. Colonic diverticulosis without diverticulitis.  CTAP 04/05/2023 1. No acute findings within the abdomen or pelvis. 2. Cirrhotic morphology of the liver with signs of portal hypertension including splenomegaly and esophageal and splenic varices. No enhancing liver lesions identified. Note: There is diminished sensitivity for hepatoma screening with portal venous and delayed phase imaging only. Given the hypervascular nature of hepatomas, the study of choice for [facility] screening would be multiphase liver protocol MRI or CT (which includes arterial phase imaging) 3. Stable enhancing lesion arising off the tail of pancreas. This is been unchanged dating back to 2014 and is favored to represent a benign etiology. 4. Colonic diverticulosis without signs of acute diverticulitis. 5.  Aortic Atherosclerosis (ICD10-I70.0).   MRI abdomen with and without contrast 01/27/2022 1. Cirrhotic hepatic morphology with sequela of portal hypertension including splenomegaly. 2. No suspicious hepatic lesion, specifically no lesion in the right lobe of the liver identified to correspond with findings on prior ultrasound. 3. Stable enhancing 4 cm soft tissue mass extending from the pancreatic tail into the splenic hilum not significantly changed dating back  to 2014 and favored to reflect a benign etiology with broad differential considerations with primary differential consideration an accessory splenule containing a lymphangioma or cystic degenerating component.  RUQ ultrasound 01/15/2022 1. There is a 19 x 8 x 19 mm hyperechoic mass in the right hepatic lobe with possible posterior shadowing. There is no correlate on comparison studies including a noncontrast CT scan from Sep 30, 2021. Specifically, there is no calcification in the right hepatic lobe to explain the findings. While atypical in appearance for a neoplasm, an MRI is recommended of the abdomen with contrast due to the patient's high risk secondary to cirrhosis. 2. Cirrhosis.    GI Procedures and Studies  EGD 04/14/2023 Striped moderately erythematous mucosa without bleeding in the gastric antrum body - no varices  Colonoscopy 04/14/2023 - One 3 mm polyp at the appendiceal orifice, removed with a cold biopsy forceps. Resected and retrieved.  - Two 6 mm polyps in the descending colon and in the cecum, removed with a cold snare. Resected and retrieved.  - Moderate diverticulosis in the sigmoid colon, in the descending colon and in the transverse colon.  - External hemorrhoids.  - The examination was otherwise normal on direct and retroflexion views.  Path:  colon - TA, SSP; normal gastric mucosa without H. Pylori  EGD 01/22/2018 -screening for esophageal varices Normal - no varices  Colonoscopy 02/22/2018 - 10 mm transverse colon polyp - Diverticula in the [place], [place], TC  Clinical Impression  It is my clinical impression that Claudia Weber is a 74 y.o. female with;  ***  Plan  *** *** *** *** ***   Planned Follow Up No follow-ups on file.  The patient or caregiver verbalized understanding of the material covered, with no barriers to understanding. All questions were answered. Patient or caregiver is agreeable with the plan outlined above.    It was a pleasure to see  Claudia Weber.  If you have any questions or concerns regarding this evaluation, do not hesitate to contact me.  Inocente Hausen, MD [facility]     [1]  Allergies Allergen Reactions   Dorethia [Aspirin] Other (See Comments)    Liver issue   Codeine Other (See Comments)    Made the patient sick   Nsaids Other (See Comments)    Liver problems   [2]  Social History Tobacco Use   Smoking status: Never   Smokeless tobacco:  Never  Vaping Use   Vaping status: Never Used  Substance Use Topics   Alcohol use: Not Currently   Drug use: Never   "

## 2024-06-27 ENCOUNTER — Ambulatory Visit: Admitting: Pediatrics

## 2024-07-19 ENCOUNTER — Ambulatory Visit: Admitting: Pediatrics
